# Patient Record
Sex: Female | Born: 1970 | Race: White | Hispanic: No
Health system: Southern US, Community
[De-identification: ages and names within clinical notes are randomized; demographics above are authoritative.]

## PROBLEM LIST (undated history)

## (undated) DIAGNOSIS — M797 Fibromyalgia: Secondary | ICD-10-CM

## (undated) DIAGNOSIS — F419 Anxiety disorder, unspecified: Secondary | ICD-10-CM

## (undated) DIAGNOSIS — F32A Depression, unspecified: Secondary | ICD-10-CM

## (undated) DIAGNOSIS — F909 Attention-deficit hyperactivity disorder, unspecified type: Secondary | ICD-10-CM

## (undated) DIAGNOSIS — M1990 Unspecified osteoarthritis, unspecified site: Secondary | ICD-10-CM

## (undated) DIAGNOSIS — E039 Hypothyroidism, unspecified: Secondary | ICD-10-CM

## (undated) DIAGNOSIS — D649 Anemia, unspecified: Secondary | ICD-10-CM

## (undated) HISTORY — DX: Fibromyalgia: M79.7

## (undated) HISTORY — PX: BRAIN SURGERY: SHX531

## (undated) HISTORY — PX: ABLATION: SHX5711

## (undated) HISTORY — PX: TONSILLECTOMY: SUR1361

## (undated) HISTORY — PX: KNEE ARTHROSCOPY: SUR90

## (undated) HISTORY — PX: BREAST ENHANCEMENT SURGERY: SHX7

## (undated) HISTORY — PX: TUBAL LIGATION: SHX77

---

## 2010-10-21 HISTORY — PX: GASTRIC BYPASS: SHX52

## 2014-10-21 HISTORY — PX: COMBINED AUGMENTATION MAMMAPLASTY AND ABDOMINOPLASTY: SUR291

## 2017-05-05 ENCOUNTER — Encounter (INDEPENDENT_AMBULATORY_CARE_PROVIDER_SITE_OTHER): Payer: Self-pay

## 2017-05-05 ENCOUNTER — Ambulatory Visit (INDEPENDENT_AMBULATORY_CARE_PROVIDER_SITE_OTHER): Payer: Self-pay

## 2017-05-05 ENCOUNTER — Encounter (INDEPENDENT_AMBULATORY_CARE_PROVIDER_SITE_OTHER): Payer: Self-pay | Admitting: Orthopaedic Surgery

## 2017-05-05 ENCOUNTER — Ambulatory Visit (INDEPENDENT_AMBULATORY_CARE_PROVIDER_SITE_OTHER): Payer: BLUE CROSS/BLUE SHIELD | Admitting: Orthopaedic Surgery

## 2017-05-05 VITALS — BP 110/71 | HR 81 | Resp 14 | Ht 67.0 in | Wt 125.0 lb

## 2017-05-05 DIAGNOSIS — M25562 Pain in left knee: Secondary | ICD-10-CM

## 2017-05-05 DIAGNOSIS — G8929 Other chronic pain: Secondary | ICD-10-CM

## 2017-05-05 MED ORDER — BUPIVACAINE HCL 0.5 % IJ SOLN
3.0000 mL | INTRAMUSCULAR | Status: AC | PRN
  Administered 2017-05-05: 3 mL via INTRA_ARTICULAR

## 2017-05-05 MED ORDER — LIDOCAINE HCL 1 % IJ SOLN
5.0000 mL | INTRAMUSCULAR | Status: AC | PRN
  Administered 2017-05-05: 5 mL

## 2017-05-05 MED ORDER — METHYLPREDNISOLONE ACETATE 40 MG/ML IJ SUSP
80.0000 mg | INTRAMUSCULAR | Status: AC | PRN
  Administered 2017-05-05: 80 mg

## 2017-05-05 NOTE — Progress Notes (Signed)
Office Visit Note   Patient: Regina Rollins           Date of Birth: 06/20/1971           MRN: [id number] Visit Date: 05/05/2017              Requested by: Verlee MonteWeeks, Susan R, NP [address] [place], [place] [id number] PCP: Verlee MonteWeeks, Susan R, NP   Assessment & Plan: Visit Diagnoses:  1. Chronic pain of left knee   Mild osteoarthritis left knee  Plan: Cortisone injection left knee today, follow-up with Visco supplementation in several weeks. Long discussion regarding diagnosis what she can expect over time. Follow-Up Instructions: Return in about 2 weeks (around 05/19/2017).   Orders:  Orders Placed This Encounter  Procedures  . XR KNEE 3 VIEW LEFT   No orders of the defined types were placed in this encounter.     Procedures: Large Joint Inj Date/Time: 05/05/2017 4:18 PM Performed by: Valeria BatmanWHITFIELD, Shellye Zandi W Authorized by: Valeria BatmanWHITFIELD, Corlene Sabia W   Consent Given by:  Patient Timeout: prior to procedure the correct patient, procedure, and site was verified   Indications:  Pain and joint swelling Location:  Knee Site:  L knee Prep: patient was prepped and draped in usual sterile fashion   Needle Size:  25 G Needle Length:  1.5 inches Approach:  Anteromedial Ultrasound Guidance: No   Fluoroscopic Guidance: No   Arthrogram: No   Medications:  3 mL bupivacaine 0.5 %; 5 mL lidocaine 1 %; 80 mg methylPREDNISolone acetate 40 MG/ML Aspiration Attempted: No   Patient tolerance:  Patient tolerated the procedure well with no immediate complications      Clinical Data: No additional findings.   Subjective: Chief Complaint  Patient presents with  . Left Knee - Pain    Regina Rollins is a 46  y o that presents with L knee pain. She relates hx of BIL knee arthroscopy and he L knee cap "moves around". She is experiencing pain and unable to sleep  Regina Rollins on has had chronic pain in her left knee for many years. I performed a knee arthroscopy in approximately 2012. I don't  have access to those notes. She believes it may have done a "release". 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 She's had some trouble off and on since that time. Sometimes she has a catching in her knee but she does note that years ago she had a course of Hyalgan with excellent relief of her pain. In the interim she's had a gastric bypass and has lost 140 pounds. Occasionally she'll a some stiffness in her left knee. No history of injury or trauma. Pain is not localized  HPI  Review of Systems   Objective: Vital Signs: BP 110/71   Pulse 81   Resp 14   Ht 5\' 7"  (1.702 m)   Wt 125 lb (56.7 kg)   BMI 19.58 kg/m   Physical Exam  Ortho Exam left knee with minimal effusion. The knee wasn't hot warm or red. No ecchymosis or erythema. No localized Tenderness along either medial or lateral joint line. Negative anterior drawer sign. No opening with a varus or valgus stress. No calf pain. No swelling distally. Neurovascular exam intact straight leg raise negative bilaterally. Painless range of motion of both hips. Patella seems somewhat mobile but no particular pain.  Specialty Comments:  No specialty comments available.  Imaging: Xr Knee 3 View Left  Result Date:  05/05/2017 3 views of the left knee were obtained standing projection. There are minimal degenerative changes in all 3 compartments. The joint spaces are well maintained. Some mild osteophytes. No ectopic calcification    PMFS History: There are no active problems to display for this patient.  No past medical history on file.  No family history on file.  Past Surgical History:  Procedure Laterality Date  . BREAST ENHANCEMENT SURGERY    . GASTRIC BYPASS    . KNEE ARTHROSCOPY    . TUBAL LIGATION    . TUBAL LIGATION     Social History   Occupational History  . Not on file.   Social History Main Topics  . Smoking status: Never Smoker  . Smokeless tobacco: Never Used  .  Alcohol use No  . Drug use: No  . Sexual activity: Not on file     Valeria Batman, MD   Note - This record has been created using [employer].  Chart creation errors have been sought, but may not always  have been located. Such creation errors do not reflect on  the standard of medical care.

## 2017-05-12 ENCOUNTER — Telehealth (INDEPENDENT_AMBULATORY_CARE_PROVIDER_SITE_OTHER): Payer: Self-pay | Admitting: Orthopaedic Surgery

## 2017-05-12 NOTE — Telephone Encounter (Signed)
[facility] called about Euflexxa injections to verify rx. Please call [phone] opt.1

## 2017-05-15 NOTE — Telephone Encounter (Signed)
Called number and they are in process of verifying info.

## 2017-05-21 ENCOUNTER — Encounter (INDEPENDENT_AMBULATORY_CARE_PROVIDER_SITE_OTHER): Payer: Self-pay | Admitting: Orthopedic Surgery

## 2017-05-21 ENCOUNTER — Ambulatory Visit (INDEPENDENT_AMBULATORY_CARE_PROVIDER_SITE_OTHER): Payer: BLUE CROSS/BLUE SHIELD | Admitting: Orthopedic Surgery

## 2017-05-21 ENCOUNTER — Encounter (INDEPENDENT_AMBULATORY_CARE_PROVIDER_SITE_OTHER): Payer: Self-pay

## 2017-05-21 VITALS — BP 122/86 | HR 87 | Resp 14 | Ht 67.0 in | Wt 144.0 lb

## 2017-05-21 DIAGNOSIS — M1712 Unilateral primary osteoarthritis, left knee: Secondary | ICD-10-CM | POA: Diagnosis not present

## 2017-05-21 MED ORDER — LIDOCAINE HCL 1 % IJ SOLN
3.0000 mL | INTRAMUSCULAR | Status: AC | PRN
  Administered 2017-05-21: 3 mL

## 2017-05-21 MED ORDER — SODIUM HYALURONATE (VISCOSUP) 20 MG/2ML IX SOSY
20.0000 mg | PREFILLED_SYRINGE | INTRA_ARTICULAR | Status: AC | PRN
  Administered 2017-05-21: 20 mg via INTRA_ARTICULAR

## 2017-05-21 NOTE — Progress Notes (Signed)
Office Visit Note   Patient: Regina Dedra SkeensMichele Rollins           Date of Birth: 10/16/1971           MRN: [id number] Visit Date: 05/21/2017              Requested by: Verlee MonteWeeks, Susan R, NP [address] [place], [place] [id number] PCP: Verlee MonteWeeks, Susan R, NP   Assessment & Plan: Visit Diagnoses:  1. Osteoarthritis of left patellofemoral joint     Plan: #1: First Euflex injection was given without difficulty to the left knee  Follow-Up Instructions: Return in about 1 week (around 05/28/2017).   Orders:  No orders of the defined types were placed in this encounter.  No orders of the defined types were placed in this encounter.     Procedures: Large Joint Inj Date/Time: 05/21/2017 9:31 AM Performed by: Jacqualine CodePETRARCA, Ahmani Prehn D Authorized by: Jacqualine CodePETRARCA, Jontavious Commons D   Consent Given by:  Patient Timeout: prior to procedure the correct patient, procedure, and site was verified   Indications:  Pain and joint swelling Location:  Knee Site:  L knee Prep: patient was prepped and draped in usual sterile fashion   Needle Size:  25 G Needle Length:  1.5 inches Approach:  Anteromedial Ultrasound Guidance: No   Fluoroscopic Guidance: No   Arthrogram: No   Medications:  20 mg Sodium Hyaluronate 20 MG/2ML; 3 mL lidocaine 1 % Aspiration Attempted: No   Patient tolerance:  Patient tolerated the procedure well with no immediate complications     Clinical Data: No additional findings.   Subjective: Chief Complaint  Patient presents with  . Right Knee - Pain  . Knee Pain    Some improvement after inj., some swelling, not diabetic, arthroscopic - Dr. Dawna PartWhtifield    Mrs. Regina Rollins has had chronic pain in her left knee for many years. 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 At that time she had a chondroplasty  patella medial femoral condyle as well as a partial lateral meniscectomy and an arthroscopic lateral release. She's had some trouble off and on since that time. Sometimes she has a catching in her knee but she does note that years ago she had a course of Hyalgan with excellent relief of her pain. In the interim she's had a gastric bypass and has lost 140 pounds. Occasionally she'll have  some stiffness in her left knee. No history of injury or trauma. She was seen 2 weeks ago had a corticosteroid injection into the knee tolerated it well. She returns today for consideration of Visco supplementation.    Review of Systems  Gastrointestinal:       H/O gastric bypass with excellent results     Objective: Vital Signs: BP 122/86 (BP Location: Right Arm, Patient Position: Sitting, Cuff Size: Normal)   Pulse 87   Resp 14   Ht 5\' 7"  (1.702 m)   Wt 144 lb (65.3 kg)   BMI 22.55 kg/m   Physical Exam  Constitutional: She is oriented to person, place, and time. She appears well-developed and well-nourished.  HENT:  Head: Normocephalic and atraumatic.  Eyes: Pupils are equal, round, and reactive to light. EOM are normal.  Pulmonary/Chest: Effort normal.  Neurological: She is alert and oriented to person, place, and time.  Skin: Skin is warm and dry.  Psychiatric: She has a normal mood and affect. Her behavior is normal. Judgment and thought content normal.    Ortho Exam  She has minimal effusion today. No warmth or erythema. Does have some patellofemoral crepitance with range of motion. Calf is supple nontender. Neurovascularly intact.  Specialty Comments:  No specialty comments available.  Imaging: No results found.   PMFS History: Patient Active Problem List   Diagnosis Date Noted  . Unilateral primary osteoarthritis, left knee 05/21/2017  . Osteoarthritis of left patellofemoral joint 05/21/2017   Past Medical History:  Diagnosis Date  . Fibromyositis     History reviewed. No  pertinent family history.  Past Surgical History:  Procedure Laterality Date  . BREAST ENHANCEMENT SURGERY    . CESAREAN SECTION    . GASTRIC BYPASS    . KNEE ARTHROSCOPY    . TUBAL LIGATION    . TUBAL LIGATION     Social History   Occupational History  . Not on file.   Social History Main Topics  . Smoking status: Former Smoker    Packs/day: 0.10    Years: 3.00    Types: Cigarettes    Quit date: 05/21/2002  . Smokeless tobacco: Never Used  . Alcohol use No  . Drug use: No  . Sexual activity: Not on file

## 2017-05-27 ENCOUNTER — Other Ambulatory Visit: Payer: Self-pay | Admitting: *Deleted

## 2017-05-28 ENCOUNTER — Encounter (INDEPENDENT_AMBULATORY_CARE_PROVIDER_SITE_OTHER): Payer: Self-pay | Admitting: Orthopedic Surgery

## 2017-05-28 ENCOUNTER — Other Ambulatory Visit: Payer: Self-pay | Admitting: Orthopedic Surgery

## 2017-05-28 ENCOUNTER — Ambulatory Visit (INDEPENDENT_AMBULATORY_CARE_PROVIDER_SITE_OTHER): Payer: BLUE CROSS/BLUE SHIELD | Admitting: Orthopedic Surgery

## 2017-05-28 DIAGNOSIS — M1712 Unilateral primary osteoarthritis, left knee: Secondary | ICD-10-CM

## 2017-05-28 MED ORDER — DICLOFENAC SODIUM 1 % TD GEL: 2.0000 g | Freq: Four times a day (QID) | TRANSDERMAL | 1 refills | Status: DC

## 2017-05-28 MED ORDER — LIDOCAINE HCL 1 % IJ SOLN
3.0000 mL | INTRAMUSCULAR | Status: AC | PRN
  Administered 2017-05-28: 3 mL

## 2017-05-28 MED ORDER — SODIUM HYALURONATE (VISCOSUP) 20 MG/2ML IX SOSY
20.0000 mg | PREFILLED_SYRINGE | INTRA_ARTICULAR | Status: AC | PRN
  Administered 2017-05-28: 20 mg via INTRA_ARTICULAR

## 2017-05-28 NOTE — Progress Notes (Signed)
   Office Visit Note   Patient: Regina Rollins           Date of Birth: 05/11/1971           MRN: [id number] Visit Date: 05/28/2017              Requested by: Verlee MonteWeeks, Susan R, NP [address] [place], [place] [id number] PCP: Verlee MonteWeeks, Susan R, NP   Assessment & Plan: Visit Diagnoses:  1. Unilateral primary osteoarthritis, left knee     Plan:  #1: The second Euflex injection was given left knee. Tolerated procedure well #2: Follow back up in 1 week for her final injection to the left knee.  Follow-Up Instructions: Return in about 1 week (around 06/04/2017).   Orders:  Orders Placed This Encounter  Procedures  . Large Joint Injection/Arthrocentesis   No orders of the defined types were placed in this encounter.     Procedures: Large Joint Inj Date/Time: 05/28/2017 9:10 AM Performed by: Jacqualine CodePETRARCA, Tavius Turgeon D Authorized by: Jacqualine CodePETRARCA, Emmali Karow D   Consent Given by:  Patient Timeout: prior to procedure the correct patient, procedure, and site was verified   Indications:  Pain and joint swelling Location:  Knee Site:  L knee Prep: patient was prepped and draped in usual sterile fashion   Needle Size:  25 G Needle Length:  1.5 inches Approach:  Anteromedial Ultrasound Guidance: No   Fluoroscopic Guidance: No   Arthrogram: No   Medications:  20 mg Sodium Hyaluronate 20 MG/2ML; 3 mL lidocaine 1 % Aspiration Attempted: No   Patient tolerance:  Patient tolerated the procedure well with no immediate complications      Clinical Data: No additional findings.   Subjective: Chief Complaint  Patient presents with  . Left Knee - Pain    HPI  Regina Rollins returns today for her second Euflexa injection. She states over the past 2 days her knee started hurting again. Denies any reactivity. Review of Systems   Objective: Vital Signs: There were no vitals taken for this visit.  Physical Exam  Ortho Exam  Exam today reveals the knee to be benign. Maybe a trace effusion.  No warmth or erythema  Specialty Comments:  No specialty comments available.  Imaging: No results found.   PMFS History: Patient Active Problem List   Diagnosis Date Noted  . Unilateral primary osteoarthritis, left knee 05/21/2017  . Osteoarthritis of left patellofemoral joint 05/21/2017   Past Medical History:  Diagnosis Date  . Fibromyositis     No family history on file.  Past Surgical History:  Procedure Laterality Date  . BREAST ENHANCEMENT SURGERY    . CESAREAN SECTION    . GASTRIC BYPASS    . KNEE ARTHROSCOPY    . TUBAL LIGATION    . TUBAL LIGATION     Social History   Occupational History  . Not on file.   Social History Main Topics  . Smoking status: Former Smoker    Packs/day: 0.10    Years: 3.00    Types: Cigarettes    Quit date: 05/21/2002  . Smokeless tobacco: Never Used  . Alcohol use No  . Drug use: No  . Sexual activity: Not on file

## 2017-05-28 NOTE — Addendum Note (Signed)
Addended by: Jacqualine CodePETRARCA, Oluwasemilore Bahl on: 05/28/2017 12:38 PM   Modules accepted: Orders

## 2017-06-04 ENCOUNTER — Ambulatory Visit (INDEPENDENT_AMBULATORY_CARE_PROVIDER_SITE_OTHER): Payer: BLUE CROSS/BLUE SHIELD | Admitting: Orthopedic Surgery

## 2017-06-04 ENCOUNTER — Encounter (INDEPENDENT_AMBULATORY_CARE_PROVIDER_SITE_OTHER): Payer: Self-pay | Admitting: Orthopedic Surgery

## 2017-06-04 VITALS — BP 125/88 | Ht 67.0 in | Wt 144.0 lb

## 2017-06-04 DIAGNOSIS — M1712 Unilateral primary osteoarthritis, left knee: Secondary | ICD-10-CM

## 2017-06-04 MED ORDER — SODIUM HYALURONATE (VISCOSUP) 20 MG/2ML IX SOSY
20.0000 mg | PREFILLED_SYRINGE | INTRA_ARTICULAR | Status: AC | PRN
  Administered 2017-06-04: 20 mg via INTRA_ARTICULAR

## 2017-06-04 MED ORDER — LIDOCAINE HCL 1 % IJ SOLN
3.0000 mL | INTRAMUSCULAR | Status: AC | PRN
  Administered 2017-06-04: 3 mL

## 2017-06-04 NOTE — Progress Notes (Signed)
Office Visit Note   Patient: Regina Rollins           Date of Birth: 09/17/1971           MRN: [id number] Visit Date: 06/04/2017              Requested by: Verlee MonteWeeks, Susan R, NP [address] [place] [id number] PCP: Verlee MonteWeeks, Susan R, NP   Assessment & Plan: Visit Diagnoses:  1. Unilateral primary osteoarthritis, left knee     Plan: #1: Third Euflexa injection was given to the left knee and tolerated the procedure well #2: Follow back up when necessary #3: Continue Voltaren gel as needed.  Follow-Up Instructions: Return if symptoms worsen or fail to improve.   Orders:  No orders of the defined types were placed in this encounter.  No orders of the defined types were placed in this encounter.     Procedures: Large Joint Inj Date/Time: 06/04/2017 11:17 AM Performed by: Jacqualine CodePETRARCA, Rogene Meth D Authorized by: Jacqualine CodePETRARCA, Dejean Tribby D   Consent Given by:  Patient Timeout: prior to procedure the correct patient, procedure, and site was verified   Indications:  Pain and joint swelling Location:  Knee Site:  L knee Prep: patient was prepped and draped in usual sterile fashion   Needle Size:  25 G Needle Length:  1.5 inches Approach:  Anteromedial Ultrasound Guidance: No   Fluoroscopic Guidance: No   Arthrogram: No   Medications:  20 mg Sodium Hyaluronate 20 MG/2ML; 3 mL lidocaine 1 % Aspiration Attempted: No   Patient tolerance:  Patient tolerated the procedure well with no immediate complications     Clinical Data: No additional findings.   Subjective: Chief Complaint  Patient presents with  . Left Knee - Injections    Patient is here for left knee injection--#3 Euflexxa injection. She says the knee is feeling better.    HPI   Regina Elon JesterMichele returns today for follow-up of her Euflex injections. She is here for her third so far. She states she is actually had improvement and is happy with the results at this time. Denies any reactivity.   Review of Systems    All other systems reviewed and are negative.    Objective: Vital Signs: BP 125/88 (BP Location: Left Arm, Patient Position: Sitting)   Ht 5\' 7"  (1.702 m)   Wt 144 lb (65.3 kg)   BMI 22.55 kg/m   Physical Exam  Ortho Exam  The left knee is quite benign at this time. No signs of inflammation. No effusion.  Specialty Comments:  No specialty comments available.  Imaging: No results found.   PMFS History: Patient Active Problem List   Diagnosis Date Noted  . Unilateral primary osteoarthritis, left knee 05/21/2017  . Osteoarthritis of left patellofemoral joint 05/21/2017   Past Medical History:  Diagnosis Date  . Fibromyositis     No family history on file.  Past Surgical History:  Procedure Laterality Date  . BREAST ENHANCEMENT SURGERY    . CESAREAN SECTION    . GASTRIC BYPASS    . KNEE ARTHROSCOPY    . TUBAL LIGATION    . TUBAL LIGATION     Social History   Occupational History  . Not on file.   Social History Main Topics  . Smoking status: Former Smoker    Packs/day: 0.10    Years: 3.00    Types: Cigarettes    Quit date: 05/21/2002  . Smokeless tobacco: Never Used  . Alcohol use No  .  Drug use: No  . Sexual activity: Not on file       

## 2017-06-19 NOTE — Telephone Encounter (Signed)
In progress

## 2017-10-20 ENCOUNTER — Ambulatory Visit (INDEPENDENT_AMBULATORY_CARE_PROVIDER_SITE_OTHER): Payer: Self-pay

## 2017-10-20 ENCOUNTER — Encounter (INDEPENDENT_AMBULATORY_CARE_PROVIDER_SITE_OTHER): Payer: Self-pay | Admitting: Orthopaedic Surgery

## 2017-10-20 ENCOUNTER — Ambulatory Visit (INDEPENDENT_AMBULATORY_CARE_PROVIDER_SITE_OTHER): Payer: BLUE CROSS/BLUE SHIELD | Admitting: Orthopaedic Surgery

## 2017-10-20 VITALS — BP 138/77 | HR 82 | Resp 14 | Ht 67.5 in | Wt 149.0 lb

## 2017-10-20 DIAGNOSIS — M79641 Pain in right hand: Secondary | ICD-10-CM

## 2017-10-20 MED ORDER — METHYLPREDNISOLONE ACETATE 40 MG/ML IJ SUSP
20.0000 mg | INTRAMUSCULAR | Status: AC | PRN
  Administered 2017-10-20: 20 mg via INTRA_ARTICULAR

## 2017-10-20 MED ORDER — LIDOCAINE HCL 1 % IJ SOLN
0.5000 mL | INTRAMUSCULAR | Status: AC | PRN
  Administered 2017-10-20: .5 mL

## 2017-10-20 NOTE — Progress Notes (Signed)
Office Visit Note   Patient: Regina Rollins           Date of Birth: 12/22/1970           MRN: [id number] Visit Date: 10/20/2017              Requested by: Verlee MonteWeeks, Susan R, NP [address] [place] [id number] PCP: Verlee MonteWeeks, Susan R, NP   Assessment & Plan: Visit Diagnoses:  1. Pain in right hand     Plan: Problem seems to be localized to the base of the thumb where there are some mild arthritic changes by x-ray. Patient does have history of fibromyalgia which I think aggravates her pain. I will inject the base of the thumb and monitor her response  Follow-Up Instructions: Return if symptoms worsen or fail to improve.   Orders:  Orders Placed This Encounter  Procedures  . Small Joint Inj: R thumb MCP  . XR Hand Complete Right   No orders of the defined types were placed in this encounter.     Procedures: Small Joint Inj: R thumb MCP on 10/20/2017 3:08 PM Indications: pain Details: 27 G needle, dorsal approach  Spinal Needle: No  Medications: 0.5 mL lidocaine 1 %; 20 mg methylPREDNISolone acetate 40 MG/ML      Clinical Data: No additional findings.   Subjective: Chief Complaint  Patient presents with  . Right Wrist - Pain  . 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 Having difficulty unscrewing lids and picking up heavy objects with what appears to be pain near the metacarpal phalangeal joint. No swelling. No numbness or tingling at this point although she has been diagnosed with mild carpal tunnel syndrome apparently with nerve conduction studies  HPI  Review of Systems  Constitutional: Positive for activity change.  HENT: Negative for trouble swallowing.   Eyes: Negative for pain.  Respiratory: Positive for  shortness of breath.   Cardiovascular: Negative for chest pain.  Gastrointestinal: Negative for constipation.  Endocrine: Positive for cold intolerance.  Genitourinary: Negative for difficulty urinating.  Musculoskeletal: Positive for joint swelling.  Skin: Negative for rash.  Allergic/Immunologic: Negative for food allergies.  Neurological: Positive for numbness.  Hematological: Does not bruise/bleed easily.  Psychiatric/Behavioral: Negative for sleep disturbance.     Objective: Vital Signs: BP 138/77 (BP Location: Left Arm, Patient Position: Sitting, Cuff Size: Normal)   Pulse 82   Resp 14   Ht 5' 7.5" (1.715 m)   Wt 149 lb (67.6 kg)   BMI 22.99 kg/m   Physical Exam  Ortho Exam awake alert and oriented 3 comfortable sitting. Examination the right wrist reveals some mild pain at the base of the thumb with a positive grind test. Regina Rollins has marked a number of areas where she is having pain but there are no skin changes is no erythema or ecchymosis or swelling. Negative Phalen's and Tinel's test. Good grip and good release. No localized tenderness in the snuffbox or over the distal radius or ulna. Good strength with opposition of thumb to little finger. Mild pain at the base of the thumb which I think is causing her most of her problem.  Specialty Comments:  No specialty comments available.  Imaging: Xr Hand Complete Right  Result  Date: 10/20/2017 Films of the right wrist obtained in several projections. There is no evidence of a fracture dislocation. Joint spaces are well maintained in the carpus. There are some mild degenerative changes in the base of the thumb. No ectopic calcification.    PMFS History: Patient Active Problem List   Diagnosis Date Noted  . Unilateral primary osteoarthritis, left knee 05/21/2017  . Osteoarthritis of left patellofemoral joint 05/21/2017   Past Medical History:  Diagnosis Date  . Fibromyositis     History reviewed. No pertinent  family history.  Past Surgical History:  Procedure Laterality Date  . BREAST ENHANCEMENT SURGERY    . CESAREAN SECTION    . GASTRIC BYPASS    . KNEE ARTHROSCOPY    . TUBAL LIGATION    . TUBAL LIGATION     Social History   Occupational History  . Not on file  Tobacco Use  . Smoking status: Former Smoker    Packs/day: 0.10    Years: 3.00    Pack years: 0.30    Types: Cigarettes    Last attempt to quit: 05/21/2002    Years since quitting: 15.4  . Smokeless tobacco: Never Used  Substance and Sexual Activity  . Alcohol use: No  . Drug use: No  . Sexual activity: Not on file

## 2017-12-29 ENCOUNTER — Encounter (INDEPENDENT_AMBULATORY_CARE_PROVIDER_SITE_OTHER): Payer: Self-pay | Admitting: Orthopaedic Surgery

## 2017-12-29 ENCOUNTER — Ambulatory Visit (INDEPENDENT_AMBULATORY_CARE_PROVIDER_SITE_OTHER): Payer: BLUE CROSS/BLUE SHIELD | Admitting: Orthopaedic Surgery

## 2017-12-29 VITALS — BP 141/95 | HR 89 | Ht 67.0 in | Wt 140.0 lb

## 2017-12-29 DIAGNOSIS — M79641 Pain in right hand: Secondary | ICD-10-CM

## 2017-12-29 MED ORDER — METHYLPREDNISOLONE ACETATE 40 MG/ML IJ SUSP
20.0000 mg | INTRAMUSCULAR | Status: AC | PRN
  Administered 2017-12-29: 20 mg via INTRA_ARTICULAR

## 2017-12-29 MED ORDER — LIDOCAINE HCL (PF) 1 % IJ SOLN
1.0000 mL | INTRAMUSCULAR | Status: AC | PRN
  Administered 2017-12-29: 1 mL

## 2017-12-29 NOTE — Progress Notes (Signed)
Office Visit Note   Patient: Regina Rollins           Date of Birth: 04/20/1971           MRN: [id number] Visit Date: 12/29/2017              Requested by: Verlee MonteWeeks, Susan R, NP [address] [place] [id number] PCP: Verlee MonteWeeks, Susan R, NP   Assessment & Plan: Visit Diagnoses:  1. Pain of right hand     Plan: osteoarthritis base of right thumb. We will reinject. Long discussion regarding treatment options over time. Also has chronic neck pain with recent CT scan elsewhere demonstrating mild arthritis at C6-7. Discussed treatment options  Follow-Up Instructions: Return if symptoms worsen or fail to improve.   Orders:  Orders Placed This Encounter  Procedures  . Small Joint Inj: R thumb CMC   No orders of the defined types were placed in this encounter.     Procedures: Small Joint Inj: R thumb CMC on 12/29/2017 1:20 PM Details: 27 G needle, dorsal approach  Spinal Needle: No  Medications: 1 mL lidocaine (PF) 1 %; 20 mg methylPREDNISolone acetate 40 MG/ML      Clinical Data: No additional findings.   Subjective: Chief Complaint  Patient presents with  . Right Hand - Pain    Regina Rollins IS 46 Y O F HERE FOR RIGHT HAND INJECTION AND WOULD LIKE TO DISCUSS NECK TENSION. GOING ON FOR ABOUT A YEAR GETTING WORSE.  last injection about 3 months ago.  Has recurrent symptoms at the base  Of right thumb. Also having chronic pain at the base of the neck. [facility] demonstrating mild arthritis at C6-7. No numbness or tingling. 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  HENT: Negative for ear pain.   Eyes: Negative for pain.  Respiratory: Negative for cough and shortness of breath.   Cardiovascular: Negative for leg swelling.  Gastrointestinal: Negative for blood in stool, constipation and diarrhea.  Genitourinary: Negative for dysuria.  Musculoskeletal: Negative for back pain and neck pain.   Skin: Negative for rash.  Allergic/Immunologic: Negative for food allergies.  Neurological: Negative for dizziness, weakness, light-headedness, numbness and headaches.  Hematological: Bruises/bleeds easily.  Psychiatric/Behavioral: Negative for sleep disturbance.     Objective: Vital Signs: BP (!) 141/95 (BP Location: Left Arm, Patient Position: Sitting, Cuff Size: Normal)   Pulse 89   Ht 5\' 7"  (1.702 m)   Wt 140 lb (63.5 kg)   BMI 21.93 kg/m   Physical Exam  Ortho Examwake alert and oriented 3. Mild pain base of right thumb with  Motion. Skin intact. Neurovascular exam int No deformity. Full range of motion of cervical spine without referred pain.no masses. No localized areas of tendernes  Specialty Comments:  No specialty comments available.  Imaging: No results found.   PMFS History: Patient Active Problem List   Diagnosis Date Noted  . Unilateral primary osteoarthritis, left knee 05/21/2017  . Osteoarthritis of left patellofemoral joint 05/21/2017   Past Medical History:  Diagnosis Date  . Fibromyositis     History reviewed. No pertinent family history.  Past Surgical History:  Procedure Laterality Date  . BREAST ENHANCEMENT SURGERY    . CESAREAN SECTION    . GASTRIC BYPASS    . KNEE ARTHROSCOPY    . TUBAL LIGATION    . TUBAL LIGATION     Social History  Occupational History  . Not on file  Tobacco Use  . Smoking status: Former Smoker    Packs/day: 0.10    Years: 3.00    Pack years: 0.30    Types: Cigarettes    Last attempt to quit: 05/21/2002    Years since quitting: 15.6  . Smokeless tobacco: Never Used  Substance and Sexual Activity  . Alcohol use: No  . Drug use: No  . Sexual activity: Not on file

## 2017-12-30 ENCOUNTER — Ambulatory Visit (INDEPENDENT_AMBULATORY_CARE_PROVIDER_SITE_OTHER): Payer: BLUE CROSS/BLUE SHIELD | Admitting: Orthopaedic Surgery

## 2018-04-09 ENCOUNTER — Ambulatory Visit (INDEPENDENT_AMBULATORY_CARE_PROVIDER_SITE_OTHER): Payer: BLUE CROSS/BLUE SHIELD | Admitting: Orthopaedic Surgery

## 2018-04-09 ENCOUNTER — Encounter (INDEPENDENT_AMBULATORY_CARE_PROVIDER_SITE_OTHER): Payer: Self-pay | Admitting: Orthopaedic Surgery

## 2018-04-09 VITALS — BP 125/79 | HR 87 | Ht 67.0 in | Wt 149.0 lb

## 2018-04-09 DIAGNOSIS — M19041 Primary osteoarthritis, right hand: Secondary | ICD-10-CM | POA: Insufficient documentation

## 2018-04-09 MED ORDER — METHYLPREDNISOLONE ACETATE 40 MG/ML IJ SUSP
20.0000 mg | INTRAMUSCULAR | Status: AC | PRN
  Administered 2018-04-09: 20 mg

## 2018-04-09 MED ORDER — LIDOCAINE HCL 1 % IJ SOLN
1.0000 mL | INTRAMUSCULAR | Status: AC | PRN
  Administered 2018-04-09: 1 mL

## 2018-04-09 NOTE — Progress Notes (Signed)
Office Visit Note   Patient: Regina Rollins           Date of Birth: 08/16/1971           MRN: [id number] Visit Date: 04/09/2018              Requested by: Verlee MonteWeeks, Susan R, NP [address] [place], [place] [id number] PCP: Verlee MonteWeeks, Susan R, NP   Assessment & Plan: Visit Diagnoses:  1. Primary osteoarthritis, right hand     Plan: 2 arthritis base of right thumb.  Long discussion again regarding diagnosis and treatment options.  At some point I think it is worth referring to a hand surgeon to consider definitive surgical treatment.  Today I will reinject with cortisone  Follow-Up Instructions: Return if symptoms worsen or fail to improve.   Orders:  Orders Placed This Encounter  Procedures  . Hand/UE Inj: L thumb CMC   No orders of the defined types were placed in this encounter.     Procedures: Hand/UE Inj: L thumb CMC for osteoarthritis on 04/09/2018 1:25 PM Medications: 1 mL lidocaine 1 %; 20 mg methylPREDNISolone acetate 40 MG/ML      Clinical Data: No additional findings.   Subjective: Chief Complaint  Patient presents with  . Right Hand - Pain  . Follow-up    R HAND PAIN CHRONIC, WOULD LIKE INJECTION   Last seen 3 months ago for evaluation of osteoarthritis of the base of the right thumb.  I injected the joint with cortisone with excellent relief of her pain.  She has had some recurrence of her pain without injury or trauma.  No numbness tingling or swelling. HPI  Review of Systems  Constitutional: Negative for fatigue and fever.  HENT: Negative for ear pain.   Eyes: Negative for pain.  Respiratory: Negative for cough and shortness of breath.   Cardiovascular: Positive for leg swelling.  Gastrointestinal: Negative for constipation and diarrhea.  Genitourinary: Negative for difficulty urinating.  Musculoskeletal: Positive for neck pain. Negative for back pain.  Skin: Negative for rash.  Allergic/Immunologic: Negative for food allergies.    Neurological: Negative for weakness and numbness.  Hematological: Bruises/bleeds easily.  Psychiatric/Behavioral: Negative for sleep disturbance.     Objective: Vital Signs: BP 125/79 (BP Location: Left Arm, Patient Position: Sitting, Cuff Size: Normal)   Pulse 87   Ht 5\' 7"  (1.702 m)   Wt 149 lb (67.6 kg)   BMI 23.34 kg/m   Physical Exam  Ortho Exam awake alert and oriented x3.  Comfortable sitting.  Exam of the right hand notes a positive grind test at the base of the thumb.  No obvious subluxation.  No swelling.  Neurovascular exam intact.  Good grip and good release.  No pain along the first dorsal extensor compartment  Specialty Comments:  No specialty comments available.  Imaging: No results found.   PMFS History: Patient Active Problem List   Diagnosis Date Noted  . Primary osteoarthritis, right hand 04/09/2018  . Unilateral primary osteoarthritis, left knee 05/21/2017  . Osteoarthritis of left patellofemoral joint 05/21/2017   Past Medical History:  Diagnosis Date  . Fibromyositis     History reviewed. No pertinent family history.  Past Surgical History:  Procedure Laterality Date  . BREAST ENHANCEMENT SURGERY    . CESAREAN SECTION    . GASTRIC BYPASS    . KNEE ARTHROSCOPY    . TUBAL LIGATION    . TUBAL LIGATION     Social History   Occupational  History  . Not on file  Tobacco Use  . Smoking status: Former Smoker    Packs/day: 0.10    Years: 3.00    Pack years: 0.30    Types: Cigarettes    Last attempt to quit: 05/21/2002    Years since quitting: 15.8  . Smokeless tobacco: Never Used  Substance and Sexual Activity  . Alcohol use: No  . Drug use: No  . Sexual activity: Not on file

## 2018-08-13 ENCOUNTER — Encounter (INDEPENDENT_AMBULATORY_CARE_PROVIDER_SITE_OTHER): Payer: Self-pay

## 2018-08-13 ENCOUNTER — Ambulatory Visit (INDEPENDENT_AMBULATORY_CARE_PROVIDER_SITE_OTHER): Payer: BLUE CROSS/BLUE SHIELD | Admitting: Orthopaedic Surgery

## 2018-08-13 ENCOUNTER — Encounter (INDEPENDENT_AMBULATORY_CARE_PROVIDER_SITE_OTHER): Payer: Self-pay | Admitting: Orthopaedic Surgery

## 2018-08-13 ENCOUNTER — Other Ambulatory Visit (INDEPENDENT_AMBULATORY_CARE_PROVIDER_SITE_OTHER): Payer: Self-pay | Admitting: Radiology

## 2018-08-13 ENCOUNTER — Other Ambulatory Visit (INDEPENDENT_AMBULATORY_CARE_PROVIDER_SITE_OTHER): Payer: Self-pay | Admitting: *Deleted

## 2018-08-13 VITALS — BP 134/86 | HR 88 | Ht 67.0 in | Wt 149.0 lb

## 2018-08-13 DIAGNOSIS — M79641 Pain in right hand: Secondary | ICD-10-CM

## 2018-08-13 DIAGNOSIS — M25531 Pain in right wrist: Secondary | ICD-10-CM

## 2018-08-13 MED ORDER — METHYLPREDNISOLONE ACETATE 40 MG/ML IJ SUSP
20.0000 mg | INTRAMUSCULAR | Status: AC | PRN
  Administered 2018-08-13: 20 mg via INTRA_ARTICULAR

## 2018-08-13 MED ORDER — LIDOCAINE HCL 1 % IJ SOLN
0.5000 mL | INTRAMUSCULAR | Status: AC | PRN
  Administered 2018-08-13: .5 mL

## 2018-08-13 MED ORDER — BUPIVACAINE HCL 0.5 % IJ SOLN
0.5000 mL | INTRAMUSCULAR | Status: AC | PRN
  Administered 2018-08-13: .5 mL via INTRA_ARTICULAR

## 2018-08-13 NOTE — Progress Notes (Signed)
Office Visit Note   Patient: Regina Rollins           Date of Birth: 1971-08-13           MRN: [id number] Visit Date: 08/13/2018              Requested by: Verlee Monte, NP [address] [place], [place] [address] PCP: Verlee Monte, NP   Assessment & Plan: Visit Diagnoses:  1. Pain of right hand     Plan: Recurrent pain right hand with prior diagnosis arthritis base of thumb.  Will inject.  Also has an area of tenderness over the radiocarpal joint which I will inject as well.  Refer to hand surgeon for further evaluation and possible treatment  Follow-Up Instructions: Return if symptoms worsen or fail to improve.   Orders:  Orders Placed This Encounter  Procedures  . Rollins Joint Inj: R thumb CMC   No orders of the defined types were placed in this encounter.     Procedures: Rollins Joint Inj: R thumb CMC on 08/13/2018 3:12 PM Details: dorsal approach Medications: 0.5 mL lidocaine 1 %; 0.5 mL bupivacaine 0.5 %; 20 mg methylPREDNISolone acetate 40 MG/ML      Clinical Data: No additional findings.   Subjective: Chief Complaint  Patient presents with  . Follow-up    R HAND WRIST PAN FOR 1 YR POSSIBLE INJECTION  Mrs. Regina Rollins has been seen on several occasions in the past for evaluation of right hand pain.  She has evidence of degenerative arthritis at the base of the thumb.  Prior cortisone injections in that area have been very helpful.  She is had some recent recurrence to the point of compromise.  She would like to have another injection and a referral to 1 of the hand surgeons.  HPI  Review of Systems  Constitutional: Negative for fatigue and fever.  Eyes: Negative for pain.  Respiratory: Negative for cough and shortness of breath.   Cardiovascular: Positive for leg swelling.  Gastrointestinal: Positive for constipation. Negative for diarrhea.  Genitourinary: Negative for difficulty urinating.  Musculoskeletal: Negative for back pain and neck pain.   Skin: Negative for rash.  Allergic/Immunologic: Negative for food allergies.  Neurological: Positive for weakness. Negative for numbness.  Hematological: Bruises/bleeds easily.  Psychiatric/Behavioral: Negative for sleep disturbance.     Objective: Vital Signs: BP 134/86 (BP Location: Left Arm, Patient Position: Sitting, Cuff Size: Normal)   Pulse 88   Ht 5\' 7"  (1.702 m)   Wt 149 lb (67.6 kg)   BMI 23.34 kg/m   Physical Exam  Constitutional: She is oriented to person, place, and time. She appears well-developed and well-nourished.  HENT:  Mouth/Throat: Oropharynx is clear and moist.  Eyes: Pupils are equal, round, and reactive to light. EOM are normal.  Pulmonary/Chest: Effort normal.  Neurological: She is alert and oriented to person, place, and time.  Skin: Skin is warm and dry.  Psychiatric: She has a normal mood and affect. Her behavior is normal.    Ortho Exam awake alert and oriented x3.  Comfortable sitting.  Pain along the carpometacarpal joint of right thumb without hypertrophic changes.  Negative grind test.  No swelling.  Neurovascular exam intact also has some tenderness over the radiocarpal joint dorsally.  No skin changes or masses.  No pain along the first dorsal extensor compartment or the IP joint of the thumb.  No carpal tunnel symptoms  Specialty Comments:  No specialty comments available.  Imaging: No  results found.   PMFS History: Patient Active Problem List   Diagnosis Date Noted  . Primary osteoarthritis, right hand 04/09/2018  . Unilateral primary osteoarthritis, left knee 05/21/2017  . Osteoarthritis of left patellofemoral joint 05/21/2017   Past Medical History:  Diagnosis Date  . Fibromyositis     History reviewed. No pertinent family history.  Past Surgical History:  Procedure Laterality Date  . BREAST ENHANCEMENT SURGERY    . CESAREAN SECTION    . GASTRIC BYPASS    . KNEE ARTHROSCOPY    . TUBAL LIGATION    . TUBAL LIGATION      Social History   Occupational History  . Not on file  Tobacco Use  . Smoking status: Former Smoker    Packs/day: 0.10    Years: 3.00    Pack years: 0.30    Types: Cigarettes    Last attempt to quit: 05/21/2002    Years since quitting: 16.2  . Smokeless tobacco: Never Used  Substance and Sexual Activity  . Alcohol use: No  . Drug use: No  . Sexual activity: Not on file

## 2018-08-20 ENCOUNTER — Ambulatory Visit (INDEPENDENT_AMBULATORY_CARE_PROVIDER_SITE_OTHER): Payer: BLUE CROSS/BLUE SHIELD | Admitting: Orthopaedic Surgery

## 2018-08-26 ENCOUNTER — Telehealth (INDEPENDENT_AMBULATORY_CARE_PROVIDER_SITE_OTHER): Payer: Self-pay | Admitting: Orthopaedic Surgery

## 2018-08-26 NOTE — Telephone Encounter (Signed)
Patient needs to pick up hand xrays to take with her to appt with Dr. Merlyn Lot today. Patient would like to pick cd up at 12:45p today, if possible. Please call patient is there are any questions, or concerns.

## 2018-10-26 ENCOUNTER — Encounter (INDEPENDENT_AMBULATORY_CARE_PROVIDER_SITE_OTHER): Payer: Self-pay | Admitting: Orthopaedic Surgery

## 2018-10-26 ENCOUNTER — Ambulatory Visit (INDEPENDENT_AMBULATORY_CARE_PROVIDER_SITE_OTHER): Payer: BLUE CROSS/BLUE SHIELD | Admitting: Orthopaedic Surgery

## 2018-10-26 ENCOUNTER — Ambulatory Visit (INDEPENDENT_AMBULATORY_CARE_PROVIDER_SITE_OTHER): Payer: Self-pay

## 2018-10-26 VITALS — BP 133/94 | HR 93 | Ht 67.0 in | Wt 145.0 lb

## 2018-10-26 DIAGNOSIS — M79675 Pain in left toe(s): Secondary | ICD-10-CM | POA: Diagnosis not present

## 2018-10-26 DIAGNOSIS — M25572 Pain in left ankle and joints of left foot: Secondary | ICD-10-CM | POA: Diagnosis not present

## 2018-10-26 DIAGNOSIS — M79676 Pain in unspecified toe(s): Secondary | ICD-10-CM

## 2018-10-26 MED ORDER — METHYLPREDNISOLONE ACETATE 40 MG/ML IJ SUSP
20.0000 mg | INTRAMUSCULAR | Status: AC | PRN
  Administered 2018-10-26: 20 mg via INTRA_ARTICULAR

## 2018-10-26 MED ORDER — LIDOCAINE HCL 1 % IJ SOLN
1.0000 mL | INTRAMUSCULAR | Status: AC | PRN
  Administered 2018-10-26: 1 mL

## 2018-10-26 NOTE — Progress Notes (Signed)
Office Visit Note   Patient: Regina Dedra SkeensMichele [name]           Date of Birth: 09/04/1971           MRN: [id number] Visit Date: 10/26/2018              Requested by: Verlee MonteWeeks, Susan R, NP [address] [place], [place] [id number] PCP: Verlee MonteWeeks, Susan R, NP   Assessment & Plan: Visit Diagnoses:  1. 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  Will inject with cortisone and obtain serum uric acid.  Much better after injection  Follow-Up Instructions: No follow-ups on file.   Orders:  Orders Placed This Encounter  Procedures  . XR Foot Complete Left   No orders of the defined types were placed in this encounter.     Procedures: Small Joint Inj: L great MTP on 10/26/2018 12:28 PM Details: 27 G needle, dorsal approach  Spinal Needle: No  Medications: 1 mL lidocaine 1 %; 20 mg methylPREDNISolone acetate 40 MG/ML      Clinical Data: No additional findings.   Subjective: Chief Complaint  Patient presents with  . Lt great toe    Pt stated sharp pain--sharp pain when putting pressure on it, no injury. Tried advil.  Insidious onset of left great toe pain within the last 7 to 10 days.  No history of injury or trauma.  No fever chills redness or swelling.  Positive family history of gout.  No other joint complaints.  No rashes  HPI  Review of Systems  Constitutional: Negative.   HENT: Negative.   Eyes: Negative.   Respiratory: Negative.   Cardiovascular: Negative.   Gastrointestinal: Negative.   Endocrine: Negative.   Genitourinary: Negative.   Musculoskeletal: Positive for gait problem.  Allergic/Immunologic: Negative.   Hematological: Negative.   Psychiatric/Behavioral: Negative.      Objective: Vital Signs: There were no vitals taken for this visit.  Physical Exam Constitutional:      Appearance: She is well-developed.  Eyes:     Pupils: Pupils are equal, round, and reactive to light.  Pulmonary:     Effort: Pulmonary effort  is normal.  Skin:    General: Skin is warm and dry.  Neurological:     Mental Status: She is alert and oriented to person, place, and time.  Psychiatric:        Behavior: Behavior normal.     Ortho Exam awake alert and oriented x3.  Comfortable sitting.  Pain diffusely about the metatarsal phalangeal joint of great toe.  No erythema or ecchymosis or swelling.  Some pain with flexion and extension.  No IP joint pain.  Nail intact.  Neurovascular exam intact  Specialty Comments:  No specialty comments available.  Imaging: No results found.   PMFS History: Patient Active Problem List   Diagnosis Date Noted  . Primary osteoarthritis, right hand 04/09/2018  . Unilateral primary osteoarthritis, left knee 05/21/2017  . Osteoarthritis of left patellofemoral joint 05/21/2017   Past Medical History:  Diagnosis Date  . Fibromyositis     No family history on file.  Past Surgical History:  Procedure Laterality Date  . BREAST ENHANCEMENT SURGERY    . CESAREAN SECTION    . GASTRIC BYPASS    . KNEE ARTHROSCOPY    . TUBAL LIGATION    . TUBAL LIGATION     Social History   Occupational History  . Not on file  Tobacco Use  .  Smoking status: Former Smoker    Packs/day: 0.10    Years: 3.00    Pack years: 0.30    Types: Cigarettes    Last attempt to quit: 05/21/2002    Years since quitting: 16.4  . Smokeless tobacco: Never Used  Substance and Sexual Activity  . Alcohol use: No  . Drug use: No  . Sexual activity: Not on file

## 2018-10-27 ENCOUNTER — Other Ambulatory Visit (INDEPENDENT_AMBULATORY_CARE_PROVIDER_SITE_OTHER): Payer: Self-pay | Admitting: *Deleted

## 2018-10-27 ENCOUNTER — Other Ambulatory Visit (INDEPENDENT_AMBULATORY_CARE_PROVIDER_SITE_OTHER): Payer: Self-pay | Admitting: Orthopaedic Surgery

## 2018-10-27 ENCOUNTER — Encounter (INDEPENDENT_AMBULATORY_CARE_PROVIDER_SITE_OTHER): Payer: Self-pay | Admitting: Orthopaedic Surgery

## 2018-10-27 LAB — URIC ACID: URIC ACID, SERUM: 4.6 mg/dL (ref 2.5–7.0)

## 2018-10-27 MED ORDER — HYDROCODONE-ACETAMINOPHEN 5-325 MG PO TABS: 1.0000 | ORAL_TABLET | Freq: Four times a day (QID) | ORAL | 0 refills | Status: DC | PRN

## 2018-10-27 NOTE — Telephone Encounter (Signed)
Sent in hydrocodone-please check to be sure she is not allergic to this med

## 2018-10-27 NOTE — Telephone Encounter (Signed)
Tramadol 50 mg #30 1-2 tabs po tid prn

## 2018-12-07 ENCOUNTER — Ambulatory Visit (INDEPENDENT_AMBULATORY_CARE_PROVIDER_SITE_OTHER): Payer: BLUE CROSS/BLUE SHIELD | Admitting: Orthopaedic Surgery

## 2018-12-07 ENCOUNTER — Encounter (INDEPENDENT_AMBULATORY_CARE_PROVIDER_SITE_OTHER): Payer: Self-pay | Admitting: Orthopaedic Surgery

## 2018-12-07 VITALS — BP 141/91 | HR 82 | Ht 67.5 in | Wt 150.0 lb

## 2018-12-07 DIAGNOSIS — M79641 Pain in right hand: Secondary | ICD-10-CM

## 2018-12-07 DIAGNOSIS — M19041 Primary osteoarthritis, right hand: Secondary | ICD-10-CM

## 2018-12-07 MED ORDER — LIDOCAINE HCL 1 % IJ SOLN
1.0000 mL | INTRAMUSCULAR | Status: AC | PRN
  Administered 2018-12-07: 1 mL

## 2018-12-07 MED ORDER — METHYLPREDNISOLONE ACETATE 40 MG/ML IJ SUSP
20.0000 mg | INTRAMUSCULAR | Status: AC | PRN
  Administered 2018-12-07: 20 mg via INTRA_ARTICULAR

## 2018-12-07 NOTE — Progress Notes (Signed)
Office Visit Note   Patient: Regina Rollins           Date of Birth: 07-30-1971           MRN: [id number] Visit Date: 12/07/2018              Requested by: Verlee Monte, NP [address] [place], [place] [address] PCP: Verlee Monte, NP   Assessment & Plan: Visit Diagnoses:  1. Pain of right hand   2. Primary osteoarthritis, right hand     Plan: Cortisone injection base of right thumb at the carpometacarpal joint and see back as needed  Follow-Up Instructions: Return if symptoms worsen or fail to improve.   Orders:  Orders Placed This Encounter  Procedures  . Small Joint Inj: R thumb CMC   No orders of the defined types were placed in this encounter.     Procedures: Small Joint Inj: R thumb CMC on 12/07/2018 3:48 PM Details: 27 G needle, dorsal approach  Spinal Needle: No  Medications: 1 mL lidocaine 1 %; 20 mg methylPREDNISolone acetate 40 MG/ML      Clinical Data: No additional findings.   Subjective: Chief Complaint  Patient presents with  . Right Hand - Pain  Patient presents today for follow up of right thumb pain. She was here four months ago and received a cortisone injection in her thumb. Patient states that the injection lasted about 45months. She said that it is hurting again. She is not taking anything for pain. She wants to get another cortisone injection today. She did see a hand surgeon since her last visit, but there was nothing offered that she can found relief with. Has tried NSAIDs which give her some temporary relief.  She did see Dr. Merlyn Lot who wanted her to try some other conservative treatment rather than proceed with surgery.  She is just frustrated with her pain and would like another cortisone injection  HPI  Review of Systems   Objective: Vital Signs: BP (!) 141/91   Pulse 82   Ht 5' 7.5" (1.715 m)   Wt 150 lb (68 kg)   BMI 23.15 kg/m   Physical Exam  Ortho Exam right thumb with some local tenderness dorsally at the  carpal metacarpal joint.  No obvious subluxation or deformity.  Skin intact neurologically intact.  No swelling.  Minimally positive grind test  Specialty Comments:  No specialty comments available.  Imaging: No results found.   PMFS History: Patient Active Problem List   Diagnosis Date Noted  . Toe joint pain, left 10/26/2018  . Primary osteoarthritis, right hand 04/09/2018  . Unilateral primary osteoarthritis, left knee 05/21/2017  . Osteoarthritis of left patellofemoral joint 05/21/2017   Past Medical History:  Diagnosis Date  . Fibromyositis     History reviewed. No pertinent family history.  Past Surgical History:  Procedure Laterality Date  . BREAST ENHANCEMENT SURGERY    . CESAREAN SECTION    . GASTRIC BYPASS    . KNEE ARTHROSCOPY    . TUBAL LIGATION    . TUBAL LIGATION     Social History   Occupational History  . Not on file  Tobacco Use  . Smoking status: Former Smoker    Packs/day: 0.10    Years: 3.00    Pack years: 0.30    Types: Cigarettes    Last attempt to quit: 05/21/2002    Years since quitting: 16.5  . Smokeless tobacco: Never Used  Substance and Sexual Activity  .  Alcohol use: No  . Drug use: No  . Sexual activity: Not on file

## 2019-01-19 ENCOUNTER — Telehealth (INDEPENDENT_AMBULATORY_CARE_PROVIDER_SITE_OTHER): Payer: Self-pay | Admitting: Orthopaedic Surgery

## 2019-01-19 NOTE — Telephone Encounter (Signed)
Called and spoke with patient. Recommended to use ice and compression wrap until her appointment on Friday, per Dr.Whitfield.

## 2019-01-19 NOTE — Telephone Encounter (Signed)
Patient called stating her left kneecap shifted and now has "jello like swelling" on the left side of her knee.  Patient scheduled an appointment with Dr. Cleophas Dunker for Friday, 01/21/19.  Patient states she has been elevating her knee, but doesn't know if she should use ice or heat, and if she should apply a compression wrap for the swelling.  Patient requested a return call.

## 2019-01-22 ENCOUNTER — Ambulatory Visit (INDEPENDENT_AMBULATORY_CARE_PROVIDER_SITE_OTHER): Payer: BLUE CROSS/BLUE SHIELD | Admitting: Orthopaedic Surgery

## 2019-01-22 ENCOUNTER — Other Ambulatory Visit: Payer: Self-pay

## 2019-01-22 ENCOUNTER — Encounter (INDEPENDENT_AMBULATORY_CARE_PROVIDER_SITE_OTHER): Payer: Self-pay | Admitting: Orthopaedic Surgery

## 2019-01-22 ENCOUNTER — Ambulatory Visit (INDEPENDENT_AMBULATORY_CARE_PROVIDER_SITE_OTHER): Payer: BLUE CROSS/BLUE SHIELD

## 2019-01-22 VITALS — BP 136/88 | HR 81 | Ht 67.0 in | Wt 149.0 lb

## 2019-01-22 DIAGNOSIS — M25462 Effusion, left knee: Secondary | ICD-10-CM | POA: Diagnosis not present

## 2019-01-22 DIAGNOSIS — M25562 Pain in left knee: Secondary | ICD-10-CM

## 2019-01-22 DIAGNOSIS — S83002A Unspecified subluxation of left patella, initial encounter: Secondary | ICD-10-CM

## 2019-01-22 MED ORDER — BUPIVACAINE HCL 0.5 % IJ SOLN
2.0000 mL | INTRAMUSCULAR | Status: AC | PRN
  Administered 2019-01-22: 2 mL via INTRA_ARTICULAR

## 2019-01-22 MED ORDER — METHYLPREDNISOLONE ACETATE 40 MG/ML IJ SUSP
80.0000 mg | INTRAMUSCULAR | Status: AC | PRN
  Administered 2019-01-22: 80 mg via INTRA_ARTICULAR

## 2019-01-22 MED ORDER — LIDOCAINE HCL 1 % IJ SOLN
2.0000 mL | INTRAMUSCULAR | Status: AC | PRN
  Administered 2019-01-22: 2 mL

## 2019-01-22 NOTE — Progress Notes (Signed)
Office Visit Note   Patient: Regina Rollins           Date of Birth: 1970-10-31           MRN: [id number] Visit Date: 01/22/2019              Requested by: Verlee Monte, NP [address] [place], [place] [address] PCP: Verlee Monte, NP   Assessment & Plan: Visit Diagnoses:  1. Acute pain of left knee   2. Subluxation of left patella, initial encounter   3. Effusion, left knee     Plan:  #1: Aspiration of approximately 35 cc of clear yellow fluid was obtained without difficulty. #2: 2 cc of Depo-Medrol was placed. #3: Patient was tried with a Shields brace  Follow-Up Instructions: Return in about 2 weeks (around 02/05/2019).   Orders:  Orders Placed This Encounter  Procedures  . Anaerobic and Aerobic Culture  . XR KNEE 3 VIEW LEFT  . Synovial cell count + diff, w/ crystals   No orders of the defined types were placed in this encounter.     Procedures: Large Joint Inj: L knee on 01/22/2019 10:39 AM Indications: pain and diagnostic evaluation Details: 25 G 1.5 in needle, anteromedial approach  Arthrogram: No  Medications: 2 mL lidocaine 1 %; 2 mL bupivacaine 0.5 %; 80 mg methylPREDNISolone acetate 40 MG/ML Aspirate: 30 mL yellow and clear; sent for lab analysis Outcome: tolerated well, no immediate complications Procedure, treatment alternatives, risks and benefits explained, specific risks discussed. Consent was given by the patient. Patient was prepped and draped in the usual sterile fashion.       Clinical Data: No additional findings.   Subjective: Chief Complaint  Patient presents with  . Left Knee - Pain  Patient presents today with left knee pain. She said that she started having swelling in her knee 3 days ago. She bent down and she felt her patella move. She is having pain on the superior lateral side of her knee and radiates around. The pain just recently started.  She has some swelling above her knee as well. She is icing her knee,  elevating, and has been wrapping her knee with an ACE.   HPI  Regina Rollins is a very pleasant 48 year old who states approximately 3 days ago she was trying to bend down and flex the left knee and felt as if her patella had moved to the lateral aspect.  Since that time she is been having pain in the superior lateral aspect of her knee which radiates into the distal thigh and proximal tibia.  Noted marked effusion by history.  And has even use an Ace wrap.  She unfortunately continues to have pain and discomfort is seen today for evaluation.   Review of Systems  Constitutional: Negative for fatigue.  HENT: Negative for ear pain.   Eyes: Negative for pain.  Respiratory: Negative for shortness of breath.   Cardiovascular: Negative for leg swelling.  Gastrointestinal: Negative for constipation and diarrhea.  Endocrine: Positive for cold intolerance. Negative for heat intolerance.  Genitourinary: Negative for difficulty urinating.  Musculoskeletal: Positive for joint swelling.  Skin: Negative for rash.  Allergic/Immunologic: Negative for food allergies.  Neurological: Positive for weakness.  Hematological: Does not bruise/bleed easily.  Psychiatric/Behavioral: Negative for sleep disturbance.     Objective: Vital Signs: BP 136/88   Pulse 81   Ht 5\' 7"  (1.702 m)   Wt 149 lb (67.6 kg)   BMI 23.34 kg/m  Physical Exam Constitutional:      Appearance: Normal appearance. She is well-developed and normal weight.  HENT:     Head: Normocephalic.  Eyes:     Pupils: Pupils are equal, round, and reactive to light.  Pulmonary:     Effort: Pulmonary effort is normal.  Skin:    General: Skin is warm and dry.  Neurological:     Mental Status: She is alert and oriented to person, place, and time.  Psychiatric:        Behavior: Behavior normal.     Ortho Exam  Exam today reveals a moderate effusion.  No warmth or erythema.  She is tender parapatellar area of the knee.  Particular painful in  the proximal tibial or distal femur area.  She can range from full extension to about 90-95 degrees.  She does have pain with subluxation of the patella.  Some patellofemoral crepitance but this is difficult to assess because of her apprehension.  Neurovascular intact distally.  Good motion of her hip without pain or discomfort.  Specialty Comments:  No specialty comments available.  Imaging: Xr Knee 3 View Left  Result Date: 01/22/2019 Three-view x-ray of the left knee reveals patellofemoral spurring especially noted on the sunrise view.  Lateral spur is noted on the patella.  She does have some mild spurring of the medial femoral condyle on sunrise also.  AP reveals some sclerosis of the medial tibial plateau.  There is also medial positioning of the patella on the standing view.    PMFS History: Current Outpatient Medications  Medication Sig Dispense Refill  . ALPRAZolam (XANAX) 1 MG tablet Take 1 mg by mouth daily.    Marland Kitchen amphetamine-dextroamphetamine (ADDERALL) 20 MG tablet Take by mouth.    . ARIPiprazole (ABILIFY) 15 MG tablet Take 15 mg by mouth daily.    . cyclobenzaprine (FLEXERIL) 10 MG tablet Take by mouth.    . gabapentin (NEURONTIN) 100 MG capsule Take 100 mg by mouth every 12 (twelve) hours as needed.    Marland Kitchen HYDROcodone-acetaminophen (NORCO/VICODIN) 5-325 MG tablet Take 1 tablet by mouth every 6 (six) hours as needed for moderate pain. 30 tablet 0  . minocycline (MINOCIN,DYNACIN) 100 MG capsule Take 100 mg by mouth 2 (two) times daily.    . pramipexole (MIRAPEX) 1 MG tablet Take 1 mg by mouth 3 (three) times daily.    . Topiramate ER (TROKENDI XR) 100 MG CP24 Take by mouth.    . traZODone (DESYREL) 150 MG tablet Take 150 mg by mouth at bedtime.     No current facility-administered medications for this visit.     Patient Active Problem List   Diagnosis Date Noted  . Toe joint pain, left 10/26/2018  . Primary osteoarthritis, right hand 04/09/2018  . Unilateral primary  osteoarthritis, left knee 05/21/2017  . Osteoarthritis of left patellofemoral joint 05/21/2017   Past Medical History:  Diagnosis Date  . Fibromyositis     History reviewed. No pertinent family history.  Past Surgical History:  Procedure Laterality Date  . BREAST ENHANCEMENT SURGERY    . CESAREAN SECTION    . GASTRIC BYPASS    . KNEE ARTHROSCOPY    . TUBAL LIGATION    . TUBAL LIGATION     Social History   Occupational History  . Not on file  Tobacco Use  . Smoking status: Former Smoker    Packs/day: 0.10    Years: 3.00    Pack years: 0.30    Types: Cigarettes  Last attempt to quit: 05/21/2002    Years since quitting: 16.6  . Smokeless tobacco: Never Used  Substance and Sexual Activity  . Alcohol use: No  . Drug use: No  . Sexual activity: Not on file

## 2019-01-25 ENCOUNTER — Encounter (INDEPENDENT_AMBULATORY_CARE_PROVIDER_SITE_OTHER): Payer: Self-pay | Admitting: Orthopaedic Surgery

## 2019-01-28 ENCOUNTER — Encounter (INDEPENDENT_AMBULATORY_CARE_PROVIDER_SITE_OTHER): Payer: Self-pay | Admitting: Orthopaedic Surgery

## 2019-01-28 ENCOUNTER — Other Ambulatory Visit (INDEPENDENT_AMBULATORY_CARE_PROVIDER_SITE_OTHER): Payer: Self-pay | Admitting: *Deleted

## 2019-01-28 DIAGNOSIS — M25562 Pain in left knee: Principal | ICD-10-CM

## 2019-01-28 DIAGNOSIS — G8929 Other chronic pain: Secondary | ICD-10-CM

## 2019-01-28 LAB — ANAEROBIC AND AEROBIC CULTURE
AER RESULT:: NO GROWTH
MICRO NUMBER:: 374043
MICRO NUMBER:: 374044
SPECIMEN QUALITY:: ADEQUATE
SPECIMEN QUALITY:: ADEQUATE

## 2019-01-28 LAB — SYNOVIAL CELL COUNT + DIFF, W/ CRYSTALS
Basophils, %: 0 %
Eosinophils-Synovial: 0 % (ref 0–2)
Lymphocytes-Synovial Fld: 6 % (ref 0–74)
Monocyte/Macrophage: 88 % — ABNORMAL HIGH (ref 0–69)
Neutrophil, Synovial: 6 % (ref 0–24)
Synoviocytes, %: 0 % (ref 0–15)
WBC, Synovial: 208 cells/uL — ABNORMAL HIGH (ref <150)

## 2019-02-08 ENCOUNTER — Encounter (INDEPENDENT_AMBULATORY_CARE_PROVIDER_SITE_OTHER): Payer: Self-pay | Admitting: *Deleted

## 2019-02-10 ENCOUNTER — Other Ambulatory Visit: Payer: Self-pay

## 2019-02-10 ENCOUNTER — Ambulatory Visit
Admission: RE | Admit: 2019-02-10 | Discharge: 2019-02-10 | Disposition: A | Discharge status: Home / Self Care | Payer: BLUE CROSS/BLUE SHIELD | Source: Ambulatory Visit | Attending: Orthopaedic Surgery | Admitting: Orthopaedic Surgery

## 2019-02-10 DIAGNOSIS — M25562 Pain in left knee: Principal | ICD-10-CM

## 2019-02-10 DIAGNOSIS — G8929 Other chronic pain: Secondary | ICD-10-CM

## 2019-02-16 ENCOUNTER — Encounter (INDEPENDENT_AMBULATORY_CARE_PROVIDER_SITE_OTHER): Payer: Self-pay | Admitting: Orthopaedic Surgery

## 2019-02-16 ENCOUNTER — Other Ambulatory Visit: Payer: Self-pay

## 2019-02-16 ENCOUNTER — Ambulatory Visit (INDEPENDENT_AMBULATORY_CARE_PROVIDER_SITE_OTHER): Payer: BLUE CROSS/BLUE SHIELD | Admitting: Orthopaedic Surgery

## 2019-02-16 VITALS — BP 129/85 | HR 75 | Ht 67.5 in | Wt 145.0 lb

## 2019-02-16 DIAGNOSIS — M19041 Primary osteoarthritis, right hand: Secondary | ICD-10-CM | POA: Diagnosis not present

## 2019-02-16 DIAGNOSIS — M1712 Unilateral primary osteoarthritis, left knee: Secondary | ICD-10-CM

## 2019-02-16 NOTE — Progress Notes (Signed)
Office Visit Note   Patient: Regina Rollins           Date of Birth: [birth year]           MRN: [id number] Visit Date: 02/16/2019              Requested by: Verlee Monte, NP [address] [place], [place] [address] PCP: Verlee Monte, NP   Assessment & Plan: Visit Diagnoses:  1. Unilateral primary osteoarthritis, left knee   2. Primary osteoarthritis, right hand     Plan: MRI of left knee demonstrates full-thickness cartilage loss of the lateral patella facet associated with subchondral reactive marrow edema.  There is partial Agnes cartilage loss of the lateral compartment and full-thickness cartilage fissuring of the lateral tibial plateau.  No meniscal or ligamentous injuries.  Minimal arthritis in the medial compartment.  Long discussion regarding her knee what she can expect over time.  She does wear a brace.  Does have meloxicam at home but upsets her stomach.  Presently she is not "doing too badly" and will work on exercises.  She does have Voltaren gel.  Also has been followed for the arthritis at the base of the right thumb.  She had an injection several months ago that helped "about 75%" has a splint.  Has long history of fibromyalgia which I really think causes a lot of her discomfort.  She has been to a pain clinic in [place] and is on medicine that "helps".  We will plan to see back as needed.  Might want to consider another injection of thumb in her knee within the next month if she still has symptoms  Follow-Up Instructions: Return if symptoms worsen or fail to improve.   Orders:  No orders of the defined types were placed in this encounter.  No orders of the defined types were placed in this encounter.     Procedures: No procedures performed   Clinical Data: No additional findings.   Subjective: Chief Complaint  Patient presents with  . Left Knee - Follow-up  Patient presents today for follow up on her left knee. She had an MRI on her left  knee on 02/10/19 and is here today for the results. She said that her knee has improved some. She is not taking anything for pain.  Long history of fibromyalgia treated through different physicians offices.  Has been to a pain clinic in [place] and is on a medicine similar to gabapentin.  "This helps".  Does have recurrent symptoms referable to her right hand.  Presently doing well with her left knee.  Has had a prior evaluation for autoimmune disease that was negative.  Takes occasional Vicodin for her pain.  Not having multiple joint arthralgias  HPI  Review of Systems   Objective: Vital Signs: BP 129/85   Pulse 75   Ht 5' 7.5" (1.715 m)   Wt 145 lb (65.8 kg)   BMI 22.38 kg/m   Physical Exam Constitutional:      Appearance: She is well-developed.  Eyes:     Pupils: Pupils are equal, round, and reactive to light.  Pulmonary:     Effort: Pulmonary effort is normal.  Skin:    General: Skin is warm and dry.  Neurological:     Mental Status: She is alert and oriented to person, place, and time.  Psychiatric:        Behavior: Behavior normal.     Ortho Exam left knee was not effused but  there was considerable patellar crepitation.  Full extension of flexion over 105 degrees without instability.  No significant medial lateral joint pain.  No distal edema.  Straight leg raise negative.  No pain with range of motion of either hip.  Right hand evaluation is unchanged.  There is some pain at the base of the thumb with a negative grind test.  No evidence of carpal tunnel.  Finkelstein's test.  Able to make a full fist and release.  Able to touch thumb to little finger  Specialty Comments:  No specialty comments available.  Imaging: No results found.   PMFS History: Patient Active Problem List   Diagnosis Date Noted  . Toe joint pain, left 10/26/2018  . Primary osteoarthritis, right hand 04/09/2018  . Unilateral primary osteoarthritis, left knee 05/21/2017  . Osteoarthritis  of left patellofemoral joint 05/21/2017   Past Medical History:  Diagnosis Date  . Fibromyositis     History reviewed. No pertinent family history.  Past Surgical History:  Procedure Laterality Date  . BREAST ENHANCEMENT SURGERY    . CESAREAN SECTION    . GASTRIC BYPASS    . KNEE ARTHROSCOPY    . TUBAL LIGATION    . TUBAL LIGATION     Social History   Occupational History  . Not on file  Tobacco Use  . Smoking status: Former Smoker    Packs/day: 0.10    Years: 3.00    Pack years: 0.30    Types: Cigarettes    Last attempt to quit: 05/21/2002    Years since quitting: 16.7  . Smokeless tobacco: Never Used  Substance and Sexual Activity  . Alcohol use: No  . Drug use: No  . Sexual activity: Not on file

## 2019-03-16 ENCOUNTER — Encounter: Payer: Self-pay | Admitting: Orthopaedic Surgery

## 2019-03-16 ENCOUNTER — Ambulatory Visit: Payer: Self-pay

## 2019-03-16 ENCOUNTER — Ambulatory Visit: Payer: BLUE CROSS/BLUE SHIELD | Admitting: Orthopaedic Surgery

## 2019-03-16 ENCOUNTER — Other Ambulatory Visit: Payer: Self-pay

## 2019-03-16 VITALS — BP 124/87 | HR 80 | Ht 67.5 in | Wt 145.0 lb

## 2019-03-16 DIAGNOSIS — M19041 Primary osteoarthritis, right hand: Secondary | ICD-10-CM

## 2019-03-16 DIAGNOSIS — M25841 Other specified joint disorders, right hand: Secondary | ICD-10-CM | POA: Diagnosis not present

## 2019-03-16 MED ORDER — METHYLPREDNISOLONE ACETATE 40 MG/ML IJ SUSP
30.0000 mg | INTRAMUSCULAR | Status: AC | PRN
  Administered 2019-03-16: 30 mg via INTRA_ARTICULAR

## 2019-03-16 MED ORDER — BUPIVACAINE HCL 0.25 % IJ SOLN
0.6600 mL | INTRAMUSCULAR | Status: AC | PRN
  Administered 2019-03-16: .66 mL via INTRA_ARTICULAR

## 2019-03-16 MED ORDER — LIDOCAINE HCL 1 % IJ SOLN
0.5000 mL | INTRAMUSCULAR | Status: AC | PRN
  Administered 2019-03-16: .5 mL

## 2019-03-16 NOTE — Progress Notes (Signed)
Office Visit Note   Patient: Regina Dedra SkeensMichele [name]           Date of Birth: 09/27/1971           MRN: [id number] Visit Date: 03/16/2019              Requested by: Verlee MonteWeeks, Susan R, NP [address] [place], [place] [id number] PCP: Verlee MonteWeeks, Susan R, NP   Assessment & Plan: Visit Diagnoses:  1. Primary osteoarthritis, right hand   2. Cyst of joint of right hand     Plan:  [facility] by Dr. Cleophas DunkerWhitfield. #2: Obtain MRI scan of the right wrist  Follow-Up Instructions: Return in about 2 weeks (around 03/30/2019).   Orders:  Orders Placed This Encounter  Procedures   Hand/UE Inj   Small Joint Inj   Medium Joint Inj   Hand/UE Inj   Small Joint Inj   Medium Joint Inj   XR Hand Complete Right   No orders of the defined types were placed in this encounter.     Procedures: Medium Joint Inj: R radiocarpal on 03/16/2019 9:28 AM Details: 25 G 1.5 in needle, dorsal approach Medications: 0.5 mL lidocaine 1 %; 0.66 mL bupivacaine 0.25 %; 30 mg methylPREDNISolone acetate 40 MG/ML Outcome: tolerated well, no immediate complications  Injected 1[facility] by Dr Cleophas DunkerWhitfield Procedure, treatment alternatives, risks and benefits explained, specific risks discussed. Consent was given by the patient. Immediately prior to procedure a time out was called to verify the correct patient, procedure, equipment, support staff and site/side marked as required. Patient was prepped and draped in the usual sterile fashion.       Clinical Data: No additional findings.   Subjective: Chief Complaint  Patient presents with   Right Hand - Follow-up   HPI: Patient presents for follow up on her right hand. She [facility] She is wanting another cortisone injection today. She said that she has noticed a cyst between her fourth and fifth metacarpals that appeared a couple days ago. She said that it is uncomfortable.  Patient has a history of fibromyalgia and has been waking up with pain in her back. She just wanted to mention it because that's unusual, and wanted to know if there was something she should be checked for.    Review of Systems  Constitutional: Negative for fatigue.  HENT: Negative for ear pain.   Eyes: Negative for pain.  Respiratory: Negative for shortness of breath.   Cardiovascular: Negative for leg swelling.  Gastrointestinal: Negative for constipation and diarrhea.  Endocrine: Positive for cold intolerance. Negative for heat intolerance.  Genitourinary: Negative for difficulty urinating.  Musculoskeletal: Positive for joint swelling.  Skin: Negative for rash.  Allergic/Immunologic: Negative for food allergies.  Neurological: Positive for weakness.  Hematological: Does not bruise/bleed easily.  Psychiatric/Behavioral: Negative for sleep disturbance.     Objective: Vital Signs: BP 124/87    Pulse 80    Ht 5' 7.5" (1.715 m)    Wt 145 lb (65.8 kg)    BMI 22.38 kg/m   Physical Exam Constitutional:      Appearance: She is well-developed.  Eyes:     Pupils: Pupils are equal, round, and reactive to light.  Pulmonary:     Effort: Pulmonary effort is normal.  Skin:    General: Skin is warm and dry.  Neurological:     Mental Status: She is alert and  oriented to person, place, and time.  Psychiatric:        Behavior: Behavior normal.     Ortho Exam  Exam today was revealing a nodular superficial mass in between the fourth and fifth metacarpals distally were not particularly painful to palpation.  No warmth erythema.  This is mobile.  She does have a positive grind test [facility].  Neurovascular intact.  Specialty Comments:  No specialty comments available.  Imaging: Xr Hand Complete Right  Result Date: 03/16/2019 Three-view x-ray of the right hand and wrist reveals some first [facility] joint arthritis.    PMFS History: Current Outpatient Medications    Medication Sig Dispense Refill   ALPRAZolam (XANAX) 1 MG tablet Take 1 mg by mouth daily.     amphetamine-dextroamphetamine (ADDERALL) 20 MG tablet Take by mouth.     ARIPiprazole (ABILIFY) 15 MG tablet Take 15 mg by mouth daily.     cyclobenzaprine (FLEXERIL) 10 MG tablet Take by mouth.     gabapentin (NEURONTIN) 100 MG capsule Take 100 mg by mouth every 12 (twelve) hours as needed.     HYDROcodone-acetaminophen (NORCO/VICODIN) 5-325 MG tablet Take 1 tablet by mouth every 6 (six) hours as needed for moderate pain. 30 tablet 0   minocycline (MINOCIN,DYNACIN) 100 MG capsule Take 100 mg by mouth 2 (two) times daily.     pramipexole (MIRAPEX) 1 MG tablet Take 1 mg by mouth 3 (three) times daily.     Topiramate ER (TROKENDI XR) 100 MG CP24 Take by mouth.     traZODone (DESYREL) 150 MG tablet Take 150 mg by mouth at bedtime.     No current facility-administered medications for this visit.     Patient Active Problem List   Diagnosis Date Noted   Toe joint pain, left 10/26/2018   Primary osteoarthritis, right hand 04/09/2018   Unilateral primary osteoarthritis, left knee 05/21/2017   Osteoarthritis of left patellofemoral joint 05/21/2017   Past Medical History:  Diagnosis Date   Fibromyositis     History reviewed. No pertinent family history.  Past Surgical History:  Procedure Laterality Date   BREAST ENHANCEMENT SURGERY     CESAREAN SECTION     GASTRIC BYPASS     KNEE ARTHROSCOPY     TUBAL LIGATION     TUBAL LIGATION     Social History   Occupational History   Not on file  Tobacco Use   Smoking status: Former Smoker    Packs/day: 0.10    Years: 3.00    Pack years: 0.30    Types: Cigarettes    Last attempt to quit: 05/21/2002    Years since quitting: 16.8   Smokeless tobacco: Never Used  Substance and Sexual Activity   Alcohol use: No   Drug use: No   Sexual activity: Not on file

## 2019-03-19 ENCOUNTER — Other Ambulatory Visit: Payer: Self-pay | Admitting: Orthopedic Surgery

## 2019-03-19 DIAGNOSIS — M19041 Primary osteoarthritis, right hand: Secondary | ICD-10-CM

## 2019-03-19 DIAGNOSIS — M25841 Other specified joint disorders, right hand: Secondary | ICD-10-CM

## 2019-03-26 ENCOUNTER — Encounter: Payer: BLUE CROSS/BLUE SHIELD | Admitting: Obstetrics and Gynecology

## 2019-04-15 ENCOUNTER — Ambulatory Visit
Admission: RE | Admit: 2019-04-15 | Discharge: 2019-04-15 | Disposition: A | Discharge status: Home / Self Care | Payer: BLUE CROSS/BLUE SHIELD | Source: Ambulatory Visit | Attending: Orthopedic Surgery | Admitting: Orthopedic Surgery

## 2019-04-15 ENCOUNTER — Other Ambulatory Visit: Payer: Self-pay

## 2019-04-15 DIAGNOSIS — M25841 Other specified joint disorders, right hand: Secondary | ICD-10-CM

## 2019-04-15 DIAGNOSIS — M19041 Primary osteoarthritis, right hand: Secondary | ICD-10-CM

## 2019-04-15 MED ORDER — IOPAMIDOL (ISOVUE-M 200) INJECTION 41%
2.0000 mL | Freq: Once | INTRAMUSCULAR | Status: AC
  Administered 2019-04-15: 2 mL via INTRA_ARTICULAR

## 2019-04-16 ENCOUNTER — Emergency Department (INDEPENDENT_AMBULATORY_CARE_PROVIDER_SITE_OTHER)
Admission: EM | Admit: 2019-04-16 | Discharge: 2019-04-16 | Disposition: A | Discharge status: Home / Self Care | Payer: BC Managed Care – PPO | Source: Home / Self Care

## 2019-04-16 ENCOUNTER — Emergency Department (INDEPENDENT_AMBULATORY_CARE_PROVIDER_SITE_OTHER): Payer: BC Managed Care – PPO

## 2019-04-16 ENCOUNTER — Other Ambulatory Visit: Payer: Self-pay

## 2019-04-16 DIAGNOSIS — R0789 Other chest pain: Secondary | ICD-10-CM

## 2019-04-16 DIAGNOSIS — R0781 Pleurodynia: Secondary | ICD-10-CM

## 2019-04-16 DIAGNOSIS — R091 Pleurisy: Secondary | ICD-10-CM

## 2019-04-16 NOTE — ED Provider Notes (Signed)
[facility]    CSN: [id number] Arrival date & time: 04/16/19  0930     History   Chief Complaint Chief Complaint  Patient presents with  . Chest Pain    HPI Jeffie Pollock is a 48 y.o. female.   HPI Jeffie Pollock is a 48 y.o. female presenting to UC with c/o Left sided chest pain gradually worsening over the last 3 weeks after attempting to move a 600lb headboard about 3 weeks ago. Pt states a corner of the headboard fell into her chest but did not knock her over. Pain is aching and sore but sharp with certain movements and deep inspiration. She has not tried anything for pain. Denies other symptoms including no URI symptoms- cough, congestion, fever, chills, n/v/d.  Pt has surgery scheduled next week for removal of hemorrhoids. During a pre-op phone triage visit yesterday, pt was advised to go to the hospital for a CXR to make sure she did not have a collapsed lung or other emergent/serious cause for her continued she pain.     History reviewed. No pertinent past medical history.  There are no active problems to display for this patient.   Past Surgical History:  Procedure Laterality Date  . BRAIN SURGERY      OB History   No obstetric history on file.      Home Medications    Prior to Admission medications   Medication Sig Start Date End Date Taking? Authorizing Provider  amphetamine-dextroamphetamine (ADDERALL) 10 MG tablet Take 10 mg by mouth daily with breakfast.   Yes [provider]    Family History History reviewed. No pertinent family history.  Social History Social History   Tobacco Use  . Smoking status: Not on file  Substance Use Topics  . Alcohol use: Not on file  . Drug use: Not on file     Allergies   Sulfa antibiotics and Penicillins   Review of Systems Review of Systems  Constitutional: Negative for chills, diaphoresis, fatigue and fever.  Respiratory: Negative for cough, chest tightness, shortness of  breath and wheezing.   Cardiovascular: Positive for chest pain. Negative for palpitations.  Gastrointestinal: Negative for abdominal pain.  Musculoskeletal: Positive for back pain (mild) and myalgias.  Skin: Negative for color change and wound.     Physical Exam Triage Vital Signs ED Triage Vitals  Enc Vitals Group     BP 04/16/19 1009 126/84     Pulse Rate 04/16/19 1009 80     Resp 04/16/19 1009 20     Temp 04/16/19 1009 98.1 F (36.7 C)     Temp Source 04/16/19 1009 Oral     SpO2 04/16/19 1009 100 %     Weight 04/16/19 1017 150 lb (68 kg)     Height 04/16/19 1017 5\' 7"  (1.702 m)     Head Circumference --      Peak Flow --      Pain Score 04/16/19 1016 4     Pain Loc --      Pain Edu? --      Excl. in [place]? --    No data found.  Updated Vital Signs BP 126/84 (BP Location: Right Arm)   Pulse 80   Temp 98.1 F (36.7 C) (Oral)   Resp 20   Ht 5\' 7"  (1.702 m)   Wt 150 lb (68 kg)   SpO2 100%   BMI 23.49 kg/m   Visual Acuity Right Eye Distance:   Left  Eye Distance:   Bilateral Distance:    Right Eye Near:   Left Eye Near:    Bilateral Near:     Physical Exam Vitals signs and nursing note reviewed.  Constitutional:      Appearance: She is well-developed.  HENT:     Head: Normocephalic and atraumatic.  Neck:     Musculoskeletal: Normal range of motion.  Cardiovascular:     Rate and Rhythm: Normal rate and regular rhythm.  Pulmonary:     Effort: Pulmonary effort is normal.     Breath sounds: No decreased breath sounds, wheezing, rhonchi or rales.  Chest:     Chest wall: Tenderness present. No deformity, swelling or crepitus.    Musculoskeletal: Normal range of motion.  Skin:    General: Skin is warm and dry.  Neurological:     Mental Status: She is alert and oriented to person, place, and time.  Psychiatric:        Behavior: Behavior normal.      UC Treatments / Results  Labs (all labs ordered are listed, but only abnormal results are displayed)  Labs Reviewed - No data to display  EKG None  Radiology Dg Ribs Unilateral W/chest Left  Result Date: 04/16/2019 CLINICAL DATA:  Heavy object fell on chest EXAM: LEFT RIBS AND CHEST - 3+ VIEW COMPARISON:  None. FINDINGS: Frontal chest as well as oblique and cone-down rib images were obtained. Lungs are clear. The heart size and pulmonary vascularity are normal. No adenopathy. No pneumothorax or pleural effusion. No rib fracture evident. IMPRESSION: No rib fracture evident.  Lungs clear. Electronically Signed   By: Bretta BangWilliam  Woodruff III M.D.   On: 04/16/2019 10:27    Procedures Procedures (including critical care time)  Medications Ordered in UC Medications - No data to display  Initial Impression / Assessment and Plan / UC Course  I have reviewed the triage vital signs and the nursing notes.  Pertinent labs & imaging results that were available during my care of the patient were reviewed by me and considered in my medical decision making (see chart for details).     Reassured pt of normal CXR Encouraged conservative tx at home F/u with PCP as needed.  Final Clinical Impressions(s) / UC Diagnoses   Final diagnoses:  Left-sided chest wall pain  Pleurisy     Discharge Instructions      Your chest x-ray looked normal. There was no sign of broken/fractured ribs or a collapsed lung.  You may alternate cool and warm compresses to help with pain.  It is recommended that you take 500mg  acetaminophen every 4-6 hours or in combination with ibuprofen 400-600mg  every 6-8 hours as needed for pain and inflammation.    Due to upcoming surgery, it is recommended you ask your surgeon about when to stop taking certain medications such as NSAIDs (ibuprofen, naproxen) prior to your surgery as these can increase bleeding during the surgery.  Please follow up with your family doctor next week if not improving.  Call 911 or go to the hospital if symptoms worsening.      ED  Prescriptions    None     Controlled Substance Prescriptions [place] Controlled Substance Registry consulted? Not Applicable   Rolla Platehelps, Rachard Isidro O, PA-C 04/16/19 1143

## 2019-04-16 NOTE — ED Triage Notes (Signed)
Pt was moving a headboard about 3 weeks ago and it fell into her upper left chest.  Has had upper left chest pain since.  It is painful to take a deep breath, painful to sit up in bed, and twisting from side to side.

## 2019-04-16 NOTE — Discharge Instructions (Signed)
°  Your chest x-ray looked normal. There was no sign of broken/fractured ribs or a collapsed lung.  You may alternate cool and warm compresses to help with pain.  It is recommended that you take 500mg  acetaminophen every 4-6 hours or in combination with ibuprofen 400-600mg  every 6-8 hours as needed for pain and inflammation.    Due to upcoming surgery, it is recommended you ask your surgeon about when to stop taking certain medications such as NSAIDs (ibuprofen, naproxen) prior to your surgery as these can increase bleeding during the surgery.  Please follow up with your family doctor next week if not improving.  Call 911 or go to the hospital if symptoms worsening.

## 2019-04-19 ENCOUNTER — Encounter: Payer: Self-pay | Admitting: Orthopaedic Surgery

## 2019-04-20 ENCOUNTER — Other Ambulatory Visit: Payer: Self-pay | Admitting: Orthopaedic Surgery

## 2019-04-20 DIAGNOSIS — M19041 Primary osteoarthritis, right hand: Secondary | ICD-10-CM

## 2019-04-20 NOTE — Telephone Encounter (Signed)
Please make her an appt to see Dr Burney Gauze...thanks

## 2019-08-12 ENCOUNTER — Other Ambulatory Visit: Payer: Self-pay

## 2019-08-12 ENCOUNTER — Encounter: Payer: Self-pay | Admitting: Orthopaedic Surgery

## 2019-08-12 ENCOUNTER — Ambulatory Visit (INDEPENDENT_AMBULATORY_CARE_PROVIDER_SITE_OTHER): Payer: BC Managed Care – PPO | Admitting: Orthopaedic Surgery

## 2019-08-12 ENCOUNTER — Ambulatory Visit: Payer: Self-pay

## 2019-08-12 ENCOUNTER — Ambulatory Visit: Payer: BC Managed Care – PPO | Admitting: Rheumatology

## 2019-08-12 VITALS — BP 127/85 | HR 84 | Ht 67.0 in | Wt 150.0 lb

## 2019-08-12 DIAGNOSIS — M19041 Primary osteoarthritis, right hand: Secondary | ICD-10-CM

## 2019-08-12 DIAGNOSIS — M79641 Pain in right hand: Secondary | ICD-10-CM | POA: Diagnosis not present

## 2019-08-12 MED ORDER — METHYLPREDNISOLONE ACETATE 40 MG/ML IJ SUSP
20.0000 mg | INTRAMUSCULAR | Status: AC | PRN
  Administered 2019-08-12: 14:00:00 20 mg

## 2019-08-12 MED ORDER — LIDOCAINE HCL 1 % IJ SOLN
1.0000 mL | INTRAMUSCULAR | Status: AC | PRN
  Administered 2019-08-12: 1 mL

## 2019-08-12 NOTE — Progress Notes (Signed)
Office Visit Note   Patient: Regina Rollins           Date of Birth: 04-May-1971           MRN: [id number] Visit Date: 08/12/2019              Requested by: Verlee Monte, NP [address] [place],  [place] [address] PCP: Verlee Monte, NP   Assessment & Plan: Visit Diagnoses:  1. Pain in right hand   2. Primary osteoarthritis, right hand     Plan:Regina has an established diagnosis of arthritis of the base of the right thumb.  She has had good response to cortisone in the past so I will reinjected.  She is also having some pain in the dorsum of her wrist.  She had an MRI scan with contrast in June demonstrating some mild degenerative changes in the carpus but no other abnormality.  That could be the cause of her problem and I will injected area of tenderness.  X-rays were negative for any abnormality about the wrist today other than minimal negative ulnar variance Follow-Up Instructions: No follow-ups on file.   Orders:  Orders Placed This Encounter  Procedures  . Hand/UE Inj  . Hand/UE Inj  . XR Hand Complete Right   No orders of the defined types were placed in this encounter.     Procedures: Hand/UE Inj for osteoarthritis on 08/12/2019 1:57 PM Indications: pain Details: dorsal approach Medications: 1 mL lidocaine 1 %; 20 mg methylPREDNISolone acetate 40 MG/ML  Injected to the base of the right thumb  Hand/UE Inj on 08/12/2019 1:58 PM Indications: pain Details: dorsal approach Medications: 1 mL lidocaine 1 %; 20 mg methylPREDNISolone acetate 40 MG/ML  Injected into the dorsal right wrist at level of tenderness      Clinical Data: No additional findings.   Subjective: Chief Complaint  Patient presents with  . Right Hand - Follow-up  Patient presents today with recurrent right hand pain. She was last evaluated five months ago and received a cortisone injection in her right 1st [facility] joint. Patient states that she has a "hardened area" and "snaps" at  her wrist. She has pain in both areas now. She is wanting cortisone injections today.   HPI  Review of Systems   Objective: Vital Signs: BP 127/85   Pulse 84   Ht 5\' 7"  (1.702 m)   Wt 150 lb (68 kg)   BMI 23.49 kg/m   Physical Exam Constitutional:      Appearance: She is well-developed.  Eyes:     Pupils: Pupils are equal, round, and reactive to light.  Pulmonary:     Effort: Pulmonary effort is normal.  Skin:    General: Skin is warm and dry.  Neurological:     Mental Status: She is alert and oriented to person, place, and time.  Psychiatric:        Behavior: Behavior normal.     Ortho Exam awake alert and oriented x3.  Comfortable sitting.  Right hand without any swelling.  There is an area of point tenderness at the mid dorsal carpus i.e. radiocarpal joint without mass formation.  There is also some positive grind test and pain at the base of the thumb where she has had prior diagnosis of osteoarthritis by MRI scan.  Neurologically intact.  No volar wrist pain.  Tinel's and Phalen's negative over the median nerve.  Good opposition of thumb to little finger.  No dorsal edema.  Extensor tendons intact full range of motion of wrist and fingers  Specialty Comments:  No specialty comments available.  Imaging: Xr Hand Complete Right  Result Date: 08/12/2019 Films of the right hand were obtained in several projections.  There is slight ulnar negative variance.  There are mild degenerative changes at the thumb carpometacarpal joint.  No ectopic calcification.  No acute changes.    PMFS History: Patient Active Problem List   Diagnosis Date Noted  . Toe joint pain, left 10/26/2018  . Primary osteoarthritis, right hand 04/09/2018  . Unilateral primary osteoarthritis, left knee 05/21/2017  . Osteoarthritis of left patellofemoral joint 05/21/2017   Past Medical History:  Diagnosis Date  . Fibromyositis     History reviewed. No pertinent family history.  Past Surgical  History:  Procedure Laterality Date  . BRAIN SURGERY    . BREAST ENHANCEMENT SURGERY    . CESAREAN SECTION    . GASTRIC BYPASS    . KNEE ARTHROSCOPY    . TUBAL LIGATION    . TUBAL LIGATION     Social History   Occupational History  . Not on file  Tobacco Use  . Smoking status: Former Smoker    Packs/day: 0.10    Years: 3.00    Pack years: 0.30    Types: Cigarettes    Quit date: 05/21/2002    Years since quitting: 17.2  . Smokeless tobacco: Never Used  Substance and Sexual Activity  . Alcohol use: No  . Drug use: No  . Sexual activity: Not on file

## 2019-11-04 IMAGING — XA FLUORO GUIDED NEEDLE PLACEMENT AND/OR ASPIRATION
2 series · 2 of 2 positions shown · non-contrast
Comparison: none

CLINICAL DATA: Right wrist pain.  Osteoarthritis.  Cyst.

[Series 2: ortho standard · 1 of 1 slices shown (1 of 2)]
[im 1/1]
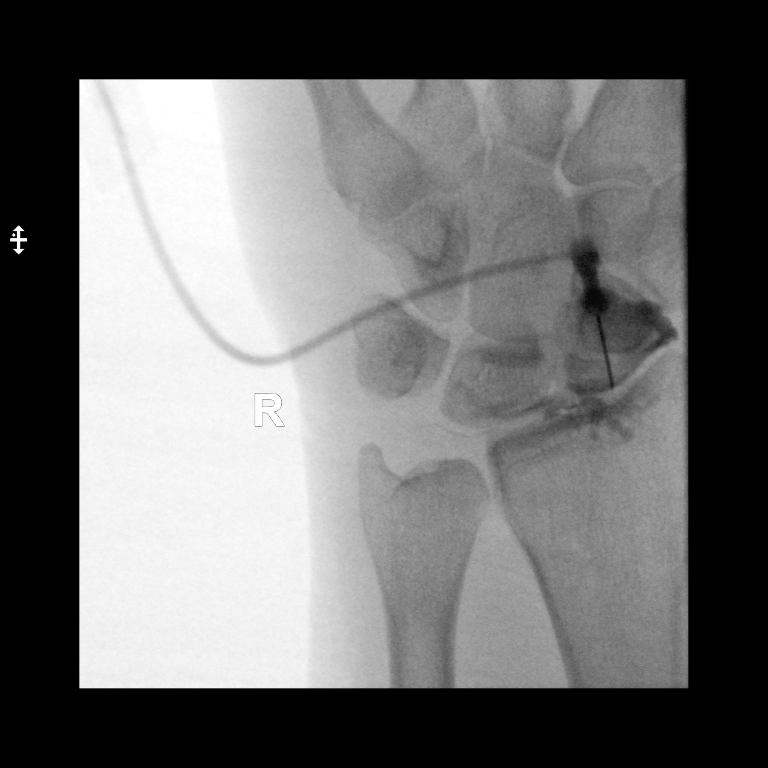

[Series 3: ortho standard · 1 of 1 slices shown (2 of 2)]
[im 1/1]
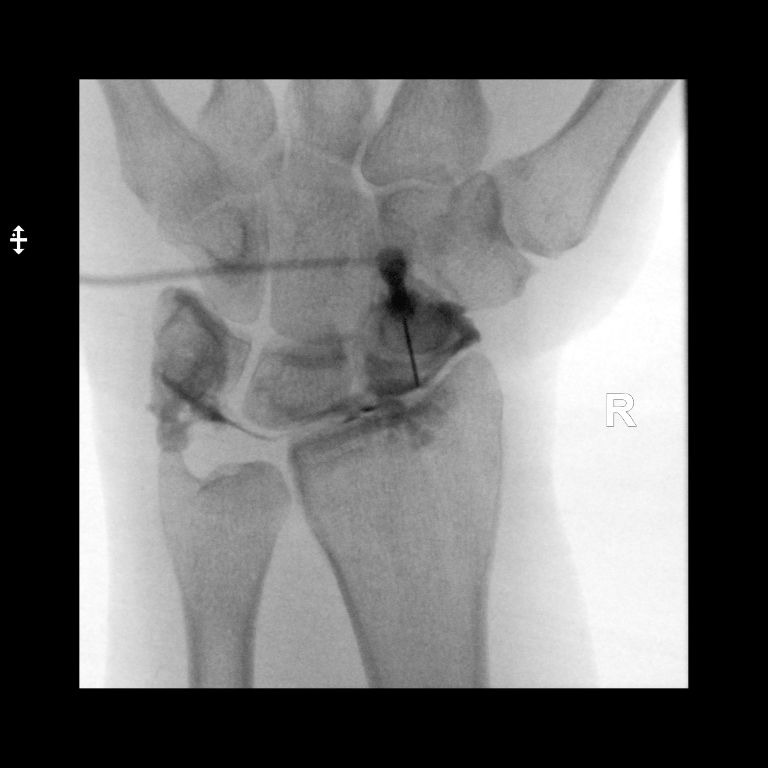

[2 of 2 positions shown; findings below may reference images not displayed]

FLUOROSCOPY TIME:  Fluoroscopy Time: 3 seconds

Radiation Exposure Index: 1.50 microGray*m^2

PROCEDURE:
RIGHT WRIST INJECTION UNDER FLUOROSCOPY

An appropriate skin entrance site was determined. The site was
marked, prepped with Betadine, draped in the usual sterile fashion,
and infiltrated locally with 1% Lidocaine. A 25 gauge skin needle
was advanced into the radiocarpal joint under intermittent
fluoroscopy. A mixture of 0.1 mL of MultiHance, 10 mL of Isovue-M
200, and 10 mL of sterile saline was then used to opacify the
proximal carpal joint. 1.5 mL of this mixture was injected. No
immediate complication.
IMPRESSION: Technically successful right wrist injection for MRI.

## 2019-11-04 IMAGING — MR MRI OF THE RIGHT WRIST WITH CONTRAST
7 series · 40 of 40 positions shown · IV contrast (agent unspecified)
Comparison: None.

CLINICAL DATA: Right wrist pain and swelling. Pain at the thumb
joint.

EXAM:
MRI OF THE RIGHT WRIST WITH CONTRAST (MR Arthrogram)
TECHNIQUE: Multiplanar, multisequence MR imaging of the wrist was performed
immediately following contrast injection into the radiocarpal joint
under fluoroscopic guidance. No intravenous contrast was
administered.

[Series 3: T1 fat-sat · axial · 3.0mm · 0.20mm/px · z∈[-37,+30]mm · 6 of 20 slices shown]
[im 1/20]
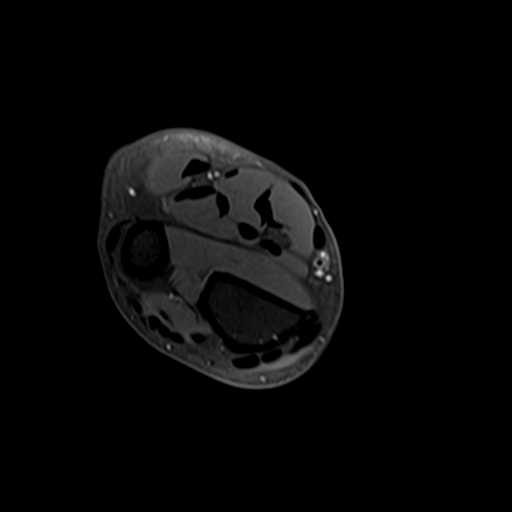
[im 4/20]
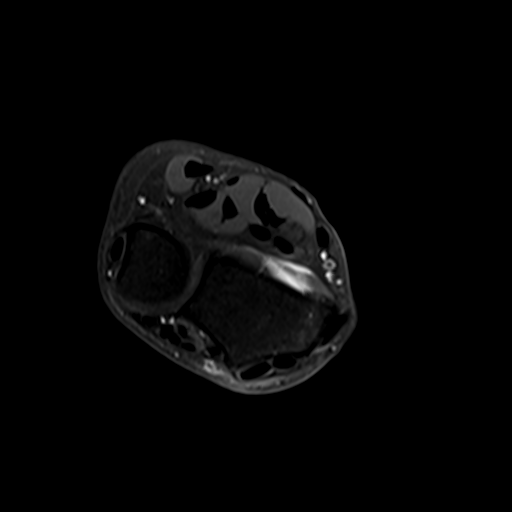
[im 8/20]
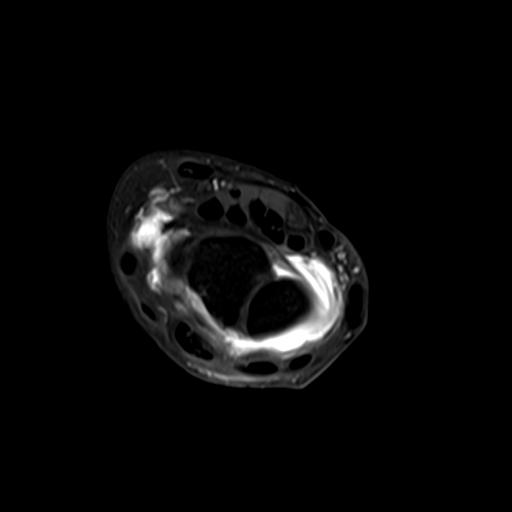
[im 12/20]
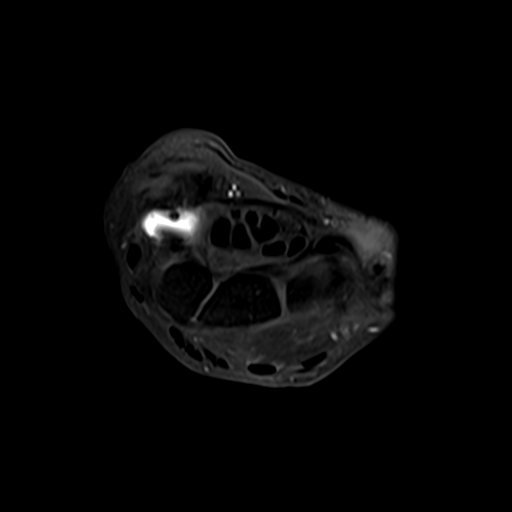
[im 16/20]
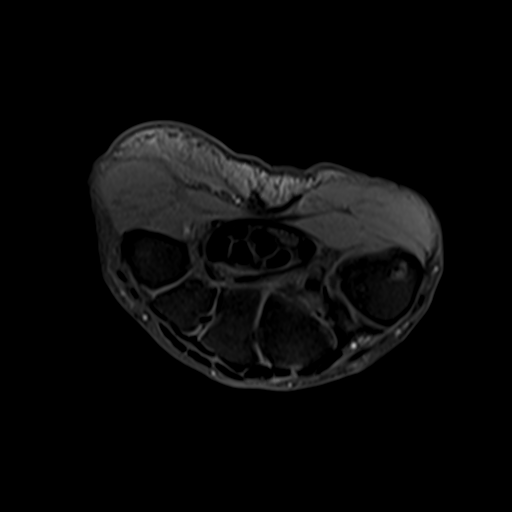
[im 20/20]
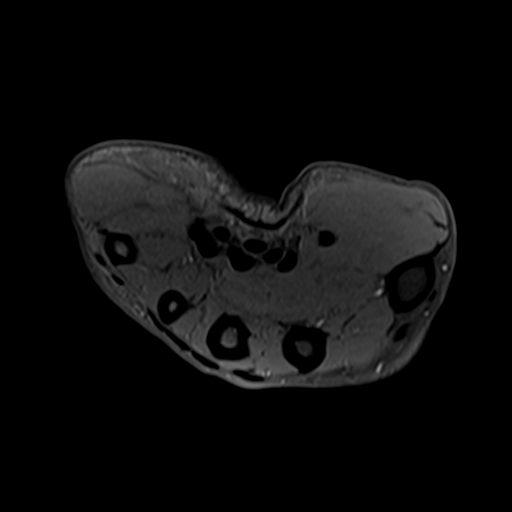

[Series 4: T2 fat-sat · axial · 3.0mm · 0.47mm/px · z∈[-36,+32]mm · 6 of 20 slices shown (1 of 3)]
[im 1/20]
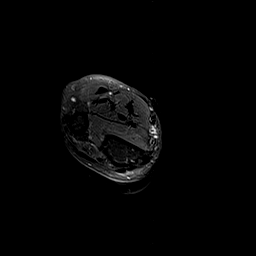
[im 4/20]
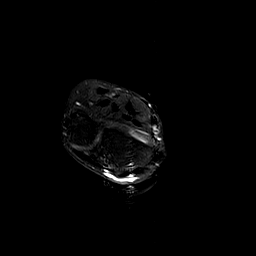
[im 8/20]
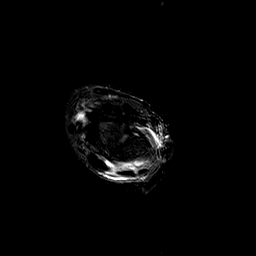
[im 12/20]
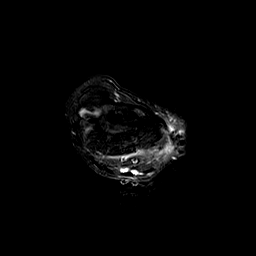
[im 16/20]
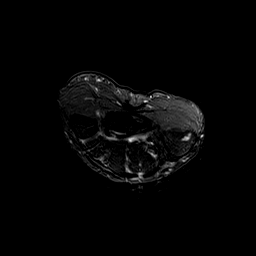
[im 20/20]
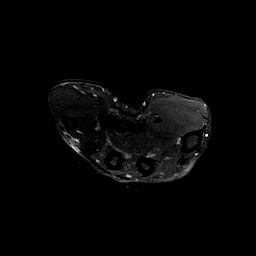

[Series 5: t1_tse_cor_fs · coronal · 3.0mm · 0.39mm/px · 5 of 16 slices shown]
[im 1/16]
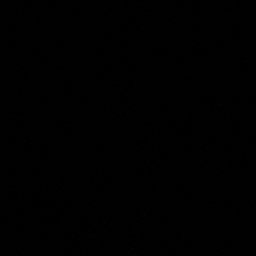
[im 4/16]
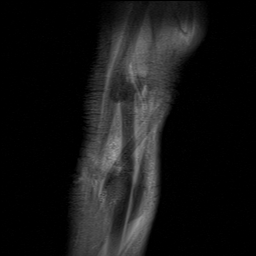
[im 8/16]
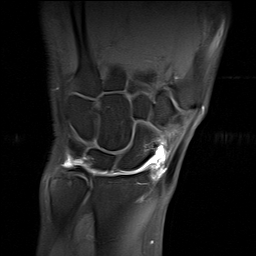
[im 12/16]
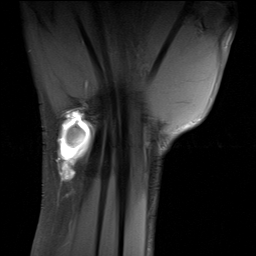
[im 16/16]
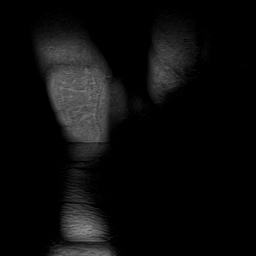

[Series 6: t1_tse_cor_no fs · coronal · 3.0mm · 0.39mm/px · 5 of 16 slices shown]
[im 1/16]
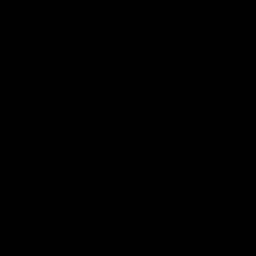
[im 4/16]
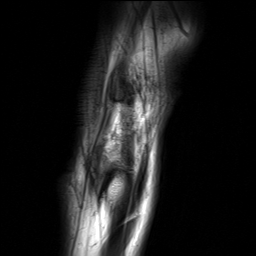
[im 8/16]
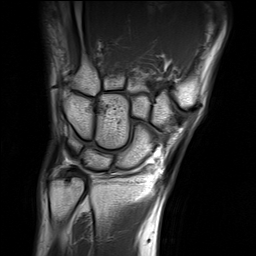
[im 12/16]
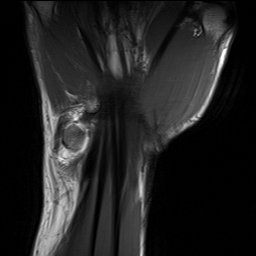
[im 16/16]
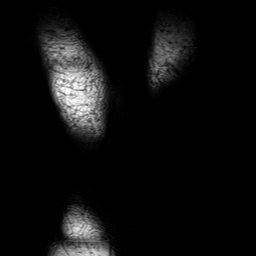

[Series 7: T2 fat-sat · coronal · 3.0mm · 0.39mm/px · 5 of 16 slices shown (2 of 3)]
[im 1/16]
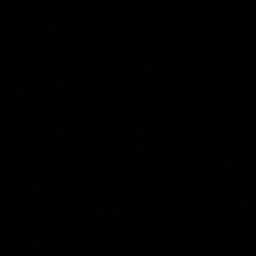
[im 4/16]
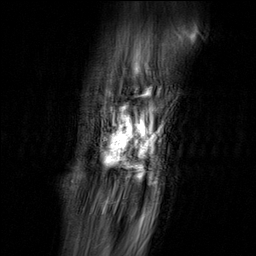
[im 8/16]
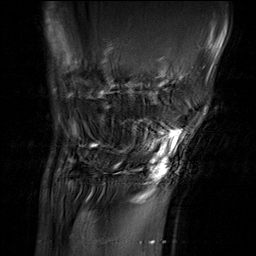
[im 12/16]
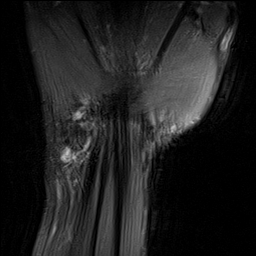
[im 16/16]
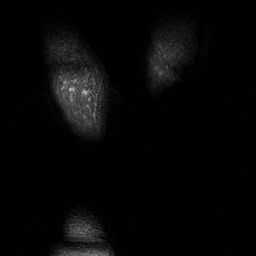

[Series 8: t1_tse_sag_fs · sagittal · 3.0mm · 0.39mm/px · 8 of 24 slices shown]
[im 1/24]
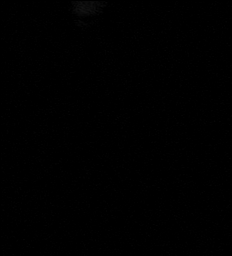
[im 4/24]
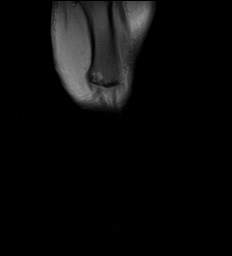
[im 7/24]
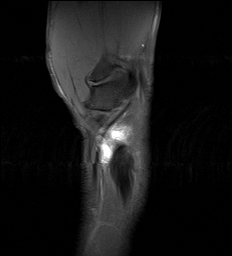
[im 10/24]
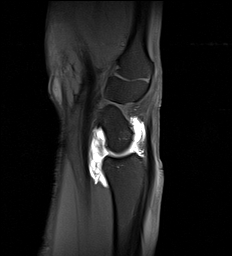
[im 14/24]
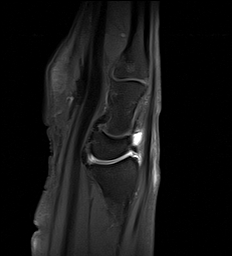
[im 17/24]
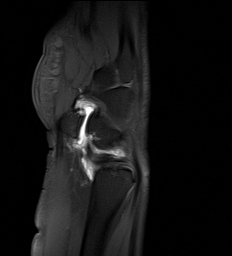
[im 20/24]
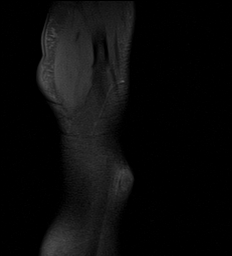
[im 24/24]
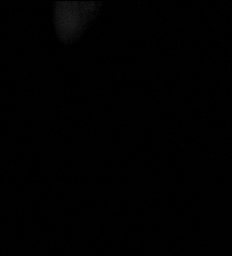

[Series 9: T2 fat-sat · coronal · 3.0mm · 0.39mm/px · 5 of 16 slices shown (3 of 3)]
[im 1/16]
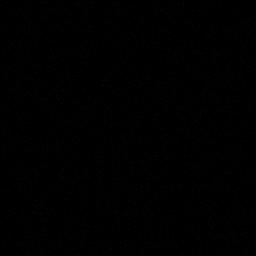
[im 4/16]
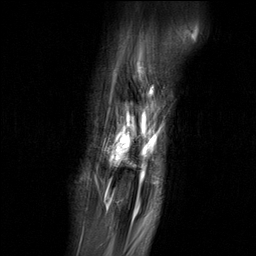
[im 8/16]
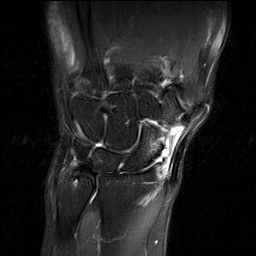
[im 12/16]
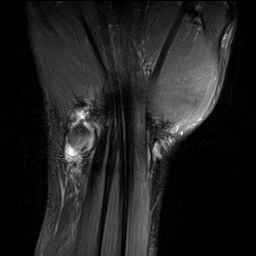
[im 16/16]
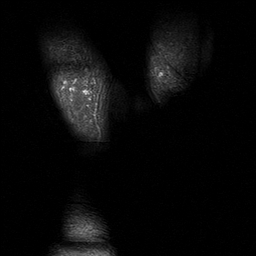

[40 of 40 positions shown; findings below may reference images not displayed]

FINDINGS: Patient motion degrades image quality limiting evaluation.

Ligaments: Scapholunate ligament and lunotriquetral ligaments are
intact.

Triangular fibrocartilage: Intact.

Tendons: Flexor and extensor compartment tendons are grossly intact.

Carpal tunnel/median nerve: Normal carpal tunnel. Normal median
nerve.

Guyon's canal: Normal.

Joint/cartilage: Intraarticular contrast in the radiocarpal joint.
No intra-articular loose body. No midcarpal joint effusion. No
distal radioulnar joint effusion. Partial-thickness cartilage loss
of the scaphotrapeziotrapezoid joint with subchondral reactive
marrow edema in the distal scaphoid and base of the trapezium.
Partial-thickness cartilage loss of the first CMC joint with
subchondral reactive marrow changes.

Bones/carpal alignment: No acute osseous abnormality. No aggressive
osseous lesion. Normal alignment.

Other: No fluid collection or hematoma.
IMPRESSION: 1. Partial-thickness cartilage loss of the scaphotrapeziotrapezoid
joint with subchondral reactive marrow edema in the distal scaphoid
and base of the trapezium.
2. Partial-thickness cartilage loss of the first CMC joint with
subchondral reactive marrow changes.
3.  No acute osseous injury of the right wrist.

## 2019-12-08 ENCOUNTER — Encounter: Payer: Self-pay | Admitting: Orthopaedic Surgery

## 2019-12-08 ENCOUNTER — Other Ambulatory Visit: Payer: Self-pay

## 2019-12-08 ENCOUNTER — Ambulatory Visit: Payer: BC Managed Care – PPO | Admitting: Orthopaedic Surgery

## 2019-12-08 VITALS — Ht 67.0 in | Wt 140.0 lb

## 2019-12-08 DIAGNOSIS — M19041 Primary osteoarthritis, right hand: Secondary | ICD-10-CM

## 2019-12-08 MED ORDER — METHYLPREDNISOLONE ACETATE 40 MG/ML IJ SUSP
20.0000 mg | INTRAMUSCULAR | Status: AC | PRN
  Administered 2019-12-08: 15:00:00 20 mg

## 2019-12-08 MED ORDER — LIDOCAINE HCL 1 % IJ SOLN
0.5000 mL | INTRAMUSCULAR | Status: AC | PRN
  Administered 2019-12-08: 15:00:00 .5 mL

## 2019-12-08 NOTE — Progress Notes (Signed)
Office Visit Note   Patient: Regina Rollins           Date of Birth: Feb 10, 1971           MRN: [id number] Visit Date: 12/08/2019              Requested by: Verlee Monte, NP [address] [place],  [place] [address] PCP: Verlee Monte, NP   Assessment & Plan: Visit Diagnoses:  1. Primary osteoarthritis, right hand     Plan: Recurrent symptoms of arthritis at the base of the right thumb and in the dorsum of the wrist where she previously had x-rays consistent with midcarpal arthritis.  I have injected both areas of tenderness with Xylocaine and Depo-Medrol  Follow-Up Instructions: Return if symptoms worsen or fail to improve.   Orders:  Orders Placed This Encounter  Procedures  . Hand/UE Inj: R thumb CMC   No orders of the defined types were placed in this encounter.     Procedures: Hand/UE Inj: R thumb CMC for osteoarthritis on 12/08/2019 2:33 PM Medications: 0.5 mL lidocaine 1 %; 20 mg methylPREDNISolone acetate 40 MG/ML  I also injected the area of tenderness in the dorsum of her right wrist where she did have some midcarpal arthritis of the milligrams of Depo-Medrol of 1 cc of 1% Xylocaine without epinephrine for both injections      Clinical Data: No additional findings.   Subjective: Chief Complaint  Patient presents with  . Right Hand - Pain  Patient presents today for recurrent right hand pain. She was last evaluated in October of 2020 and received a cortisone injection in her thumb and dorsal wrist area. She started hurting about a week ago and wants to get the same injections again today. She is unable to grasp and twist anything. She does not take anything for pain. The Voltaren gel does not help, but the CBD oil helps some.   HPI  Review of Systems   Objective: Vital Signs: Ht 5\' 7"  (1.702 m)   Wt 140 lb (63.5 kg)   BMI 21.93 kg/m   Physical Exam  Ortho Exam right hand was not hot red warm or swollen.  There are some degenerative  changes at the base of the right thumb at the carpometacarpal junction.  There is partial subluxation and a positive grind test.  Good grip and release.  Also some area of tenderness in the mid dorsum of her wrist near the radiocarpal joint where she had prior x-rays consistent with arthritic changes.  Neurologically intact  Specialty Comments:  No specialty comments available.  Imaging: No results found.   PMFS History: Patient Active Problem List   Diagnosis Date Noted  . Toe joint pain, left 10/26/2018  . Primary osteoarthritis, right hand 04/09/2018  . Unilateral primary osteoarthritis, left knee 05/21/2017  . Osteoarthritis of left patellofemoral joint 05/21/2017   Past Medical History:  Diagnosis Date  . Fibromyositis     History reviewed. No pertinent family history.  Past Surgical History:  Procedure Laterality Date  . BRAIN SURGERY    . BREAST ENHANCEMENT SURGERY    . CESAREAN SECTION    . GASTRIC BYPASS    . KNEE ARTHROSCOPY    . TUBAL LIGATION    . TUBAL LIGATION     Social History   Occupational History  . Not on file  Tobacco Use  . Smoking status: Former Smoker    Packs/day: 0.10    Years: 3.00  Pack years: 0.30    Types: Cigarettes    Quit date: 05/21/2002    Years since quitting: 17.5  . Smokeless tobacco: Never Used  Substance and Sexual Activity  . Alcohol use: No  . Drug use: No  . Sexual activity: Not on file

## 2020-02-03 ENCOUNTER — Telehealth: Payer: Self-pay | Admitting: Orthopaedic Surgery

## 2020-02-03 ENCOUNTER — Encounter: Payer: Self-pay | Admitting: Orthopaedic Surgery

## 2020-02-03 ENCOUNTER — Other Ambulatory Visit: Payer: Self-pay | Admitting: Orthopaedic Surgery

## 2020-02-03 MED ORDER — HYDROCODONE-ACETAMINOPHEN 5-325 MG PO TABS: 1.0000 | ORAL_TABLET | Freq: Four times a day (QID) | ORAL | 0 refills | Status: DC | PRN

## 2020-02-03 NOTE — Telephone Encounter (Signed)
Please advise 

## 2020-02-03 NOTE — Telephone Encounter (Signed)
Patient called stating that her right hand is not any better and now it is hurting in another spot.  She had the injection, but it did not help.  She would like a return phone call to discuss.  SM#[phone].  Thank you.

## 2020-02-03 NOTE — Telephone Encounter (Signed)
Offer an earlier appt or hydrocodone for pain

## 2020-02-03 NOTE — Telephone Encounter (Signed)
I would suggest referral to hand surgeons-if she agrees please refer to Dr Merlyn Lot

## 2020-02-03 NOTE — Telephone Encounter (Signed)
Spoke with patient.  She is already established with Dr.Kuzma and will call them about an appointment. She also wanted pain medicine to hold her over until her appointment Dr.Kuzma. I referred her to speak with Dr.Kuzma about pain medicine since he will be following her for the pain in her hands. She will call us if she needs anything else.

## 2020-02-04 ENCOUNTER — Other Ambulatory Visit: Payer: Self-pay | Admitting: Orthopaedic Surgery

## 2020-02-04 NOTE — Telephone Encounter (Signed)
Rx sent in yesterday for hydrocodone

## 2020-02-08 ENCOUNTER — Other Ambulatory Visit: Payer: Self-pay

## 2020-02-08 ENCOUNTER — Encounter: Payer: Self-pay | Admitting: Orthopaedic Surgery

## 2020-02-08 ENCOUNTER — Ambulatory Visit: Payer: BC Managed Care – PPO | Admitting: Orthopaedic Surgery

## 2020-02-08 VITALS — Ht 67.0 in | Wt 140.0 lb

## 2020-02-08 DIAGNOSIS — M19041 Primary osteoarthritis, right hand: Secondary | ICD-10-CM | POA: Diagnosis not present

## 2020-02-08 MED ORDER — METHYLPREDNISOLONE ACETATE 40 MG/ML IJ SUSP
20.0000 mg | INTRAMUSCULAR | Status: AC | PRN
  Administered 2020-02-08: 10:00:00 20 mg

## 2020-02-08 MED ORDER — LIDOCAINE HCL 1 % IJ SOLN
0.5000 mL | INTRAMUSCULAR | Status: AC | PRN
  Administered 2020-02-08: 10:00:00 .5 mL

## 2020-02-08 NOTE — Progress Notes (Signed)
Office Visit Note   Patient: Regina Rollins           Date of Birth: Jun 04, 1971           MRN: [id number] Visit Date: 02/08/2020              Requested by: Verlee Monte, NP [address] [place],  [place] [address] PCP: Verlee Monte, NP   Assessment & Plan: Visit Diagnoses:  1. Primary osteoarthritis, right hand     Plan: Regina Rollins returns for evaluation of the pain at the base of her right thumb.  Prior films demonstrated some osteoarthritis.  She has responded temporarily to cortisone injections in the past.  She has tried a splint CBD oil and Voltaren gel.  She has been taking hydrocodone.  She has had a difficult time following up with Dr. Merlyn Lot and so we have resubmitted a consult and hopefully they will call her in the near future.  I am not sure what else to do from from my standpoint.  He initially had placed her after his first evaluation on Cymbalta but she is not sure that that is helped.  Her pain is localized along the base of the thumb at the metacarpal carpal joint.  I reinjected her today and will see her back as needed.  She has been tested twice in the past for carpal tunnel symptoms these were performed elsewhere and she notes that they were "normal" on the last exam  Follow-Up Instructions: Return if symptoms worsen or fail to improve.   Orders:  Orders Placed This Encounter  Procedures  . Hand/UE Inj: R thumb CMC  . Ambulatory referral to Orthopedic Surgery   No orders of the defined types were placed in this encounter.     Procedures: Hand/UE Inj: R thumb CMC for osteoarthritis on 02/08/2020 10:28 AM Medications: 0.5 mL lidocaine 1 %; 20 mg methylPREDNISolone acetate 40 MG/ML      Clinical Data: No additional findings.   Subjective: Chief Complaint  Patient presents with  . Right Hand - Pain  Patient presents today for right hand pain. She was here last in February and received a [facility] joint injection. She said that the injection did not  help this past time. She is taking hydrocodone that Dr.Sydnei Ohaver prescribed last week. She states that the medication helps. The pain will wake her at night. The pain is worse with movement or use of her hand. She is right hand dominant. No swelling.  Has had prior injections at the base of the thumb with temporary relief.  We have tried splints, Voltaren gel and even CBD oil.  She is having a difficult time tolerating her pain.  She did see Dr. Merlyn Lot on 1 occasion he placed her on Cymbalta.  She is not sure that that has helped either.  I will reconsult Dr. Merlyn Lot  HPI  Review of Systems   Objective: Vital Signs: Ht 5\' 7"  (1.702 m)   Wt 140 lb (63.5 kg)   BMI 21.93 kg/m   Physical Exam Constitutional:      Appearance: She is well-developed.  Eyes:     Pupils: Pupils are equal, round, and reactive to light.  Pulmonary:     Effort: Pulmonary effort is normal.  Skin:    General: Skin is warm and dry.  Neurological:     Mental Status: She is alert and oriented to person, place, and time.  Psychiatric:        Behavior:  Behavior normal.     Ortho Exam right hand with negative Phalen's and Tinel's over the median nerve.  Good opposition of thumb to little finger with good strength.  Does have some pain at the base of the thumb at the metacarpal carpal joint.  Negative grind test.  No obvious subluxation.  No redness.  Neurologically intact.  Negative de Quervain's testing.  Specialty Comments:  No specialty comments available.  Imaging: No results found.   PMFS History: Patient Active Problem List   Diagnosis Date Noted  . Toe joint pain, left 10/26/2018  . Primary osteoarthritis, right hand 04/09/2018  . Unilateral primary osteoarthritis, left knee 05/21/2017  . Osteoarthritis of left patellofemoral joint 05/21/2017   Past Medical History:  Diagnosis Date  . Fibromyositis     History reviewed. No pertinent family history.  Past Surgical History:  Procedure Laterality  Date  . BRAIN SURGERY    . BREAST ENHANCEMENT SURGERY    . CESAREAN SECTION    . GASTRIC BYPASS    . KNEE ARTHROSCOPY    . TUBAL LIGATION    . TUBAL LIGATION     Social History   Occupational History  . Not on file  Tobacco Use  . Smoking status: Former Smoker    Packs/day: 0.10    Years: 3.00    Pack years: 0.30    Types: Cigarettes    Quit date: 05/21/2002    Years since quitting: 17.7  . Smokeless tobacco: Never Used  Substance and Sexual Activity  . Alcohol use: No  . Drug use: No  . Sexual activity: Not on file

## 2020-02-16 ENCOUNTER — Ambulatory Visit: Payer: BC Managed Care – PPO | Admitting: Orthopaedic Surgery

## 2020-07-17 ENCOUNTER — Telehealth: Payer: Self-pay | Admitting: Orthopaedic Surgery

## 2020-07-17 NOTE — Telephone Encounter (Signed)
Tried to call patient. No answer. Left message that she either needed to follow up with Dr.Kuzma or wait until her appointment with Dr.Whitfield to discuss pain medication. I ask her to call back with any further questions.

## 2020-07-17 NOTE — Telephone Encounter (Signed)
Patient called requesting pain medication for hand. Patient states she need something to relieve hand pains until upcoming appt. Please send to pharmacy on file. Patient phone number is [phone].

## 2020-07-17 NOTE — Telephone Encounter (Signed)
Please advise. Patient saw Dr.Kuzma on 02/21/2020, after seeing you. She was suppose to follow up in 2 months with him for injections, but did not. She instead if scheduled with you for 07-20-20 for injections.

## 2020-07-17 NOTE — Telephone Encounter (Signed)
Will need to wait until appt for any meds or f/u with Dr Merlyn Lot

## 2020-07-20 ENCOUNTER — Encounter: Payer: Self-pay | Admitting: Orthopaedic Surgery

## 2020-07-20 ENCOUNTER — Other Ambulatory Visit: Payer: Self-pay

## 2020-07-20 ENCOUNTER — Ambulatory Visit (INDEPENDENT_AMBULATORY_CARE_PROVIDER_SITE_OTHER): Payer: BLUE CROSS/BLUE SHIELD | Admitting: Orthopaedic Surgery

## 2020-07-20 VITALS — Ht 67.0 in | Wt 140.0 lb

## 2020-07-20 DIAGNOSIS — M19041 Primary osteoarthritis, right hand: Secondary | ICD-10-CM

## 2020-07-20 MED ORDER — METHYLPREDNISOLONE ACETATE 40 MG/ML IJ SUSP
20.0000 mg | INTRAMUSCULAR | Status: AC | PRN
  Administered 2020-07-20: 20 mg

## 2020-07-20 MED ORDER — LIDOCAINE HCL 1 % IJ SOLN
1.0000 mL | INTRAMUSCULAR | Status: AC | PRN
  Administered 2020-07-20: 1 mL

## 2020-07-20 NOTE — Progress Notes (Signed)
Office Visit Note   Patient: Regina Rollins           Date of Birth: [birth year]           MRN: [id number] Visit Date: 07/20/2020              Requested by: Verlee Monte, NP [address] [place],  [place] [address] PCP: Verlee Monte, NP   Assessment & Plan: Visit Diagnoses:  1. Primary osteoarthritis, right hand     Plan: Regina Rollins has recurrent symptoms of osteoarthritis at the base of the right thumb at the carpometacarpal joint.  She also is having some discomfort and what appears to be carpal bossing in the mid carpus.  She has had successful injections in the past.  I will repeat those today.  She does have a basilar thumb splint.  She has seen Dr. Merlyn Lot in regards to surgical consideration but would prefer at this point to continue to pursue nonoperative treatment  Follow-Up Instructions: Return if symptoms worsen or fail to improve.   Orders:  Orders Placed This Encounter  Procedures   Hand/UE Inj: R thumb CMC   No orders of the defined types were placed in this encounter.     Procedures: Hand/UE Inj: R thumb CMC for osteoarthritis on 07/20/2020 4:51 PM Medications: 1 mL lidocaine 1 %; 20 mg methylPREDNISolone acetate 40 MG/ML      Clinical Data: No additional findings.   Subjective: Chief Complaint  Patient presents with   Right Hand - Pain  Patient presents today for recurrent chronic right hand pain, located at the right thumb. She was last here in April and received a cortisone injection at the thumb [facility]. She then went to see Dr.Kuzma. She is here today because the pain has returned and she would like another injection or two. She has started to a new job and uses her hands a lot. She would like a script for Voltaren gel sent in.   HPI  Review of Systems   Objective: Vital Signs: Ht 5\' 7"  (1.702 m)    Wt 140 lb (63.5 kg)    BMI 21.93 kg/m   Physical Exam Constitutional:      Appearance: She is well-developed.  Eyes:     Pupils:  Pupils are equal, round, and reactive to light.  Pulmonary:     Effort: Pulmonary effort is normal.  Skin:    General: Skin is warm and dry.  Neurological:     Mental Status: She is alert and oriented to person, place, and time.  Psychiatric:        Behavior: Behavior normal.     Ortho Exam awake alert and oriented x3.  Comfortable sitting.  There is some partial subluxation at the base of the right thumb carpometacarpal joint with positive grind test and pain.  No swelling.  Neurologically intact.  Also has some prominence of the mid dorsum of the hand that I suspect may be carpal bossing.  No Tinel's no distal edema.  Full grip and release  Specialty Comments:  No specialty comments available.  Imaging: No results found.   PMFS History: Patient Active Problem List   Diagnosis Date Noted   Toe joint pain, left 10/26/2018   Primary osteoarthritis, right hand 04/09/2018   Unilateral primary osteoarthritis, left knee 05/21/2017   Osteoarthritis of left patellofemoral joint 05/21/2017   Past Medical History:  Diagnosis Date   Fibromyositis     History reviewed. No pertinent family  history.  Past Surgical History:  Procedure Laterality Date   BRAIN SURGERY     BREAST ENHANCEMENT SURGERY     CESAREAN SECTION     GASTRIC BYPASS     KNEE ARTHROSCOPY     TUBAL LIGATION     TUBAL LIGATION     Social History   Occupational History   Not on file  Tobacco Use   Smoking status: Former Smoker    Packs/day: 0.10    Years: 3.00    Pack years: 0.30    Types: Cigarettes    Quit date: 05/21/2002    Years since quitting: 18.1   Smokeless tobacco: Never Used  Vaping Use   Vaping Use: Never used  Substance and Sexual Activity   Alcohol use: No   Drug use: No   Sexual activity: Not on file

## 2020-11-28 ENCOUNTER — Encounter: Payer: Self-pay | Admitting: Orthopaedic Surgery

## 2020-11-28 ENCOUNTER — Ambulatory Visit (INDEPENDENT_AMBULATORY_CARE_PROVIDER_SITE_OTHER): Payer: BLUE CROSS/BLUE SHIELD | Admitting: Orthopaedic Surgery

## 2020-11-28 ENCOUNTER — Other Ambulatory Visit: Payer: Self-pay

## 2020-11-28 VITALS — Ht 67.0 in | Wt 140.0 lb

## 2020-11-28 DIAGNOSIS — M19041 Primary osteoarthritis, right hand: Secondary | ICD-10-CM

## 2020-11-28 DIAGNOSIS — M19042 Primary osteoarthritis, left hand: Secondary | ICD-10-CM

## 2020-11-28 NOTE — Progress Notes (Signed)
Office Visit Note   Patient: Regina Rollins           Date of Birth: 07-20-1971           MRN: [id number] Visit Date: 11/28/2020              Requested by: Verlee Monte, NP [address] [place],  [place] [address] PCP: Verlee Monte, NP   Assessment & Plan: Visit Diagnoses:  1. Primary osteoarthritis, right hand   2. Primary osteoarthritis, left hand     Plan: Recurrent symptoms of osteoarthritis base of both thumbs. Has done well with cortisone injections in the past. We will repeat cortisone injections basilar thumb joints both thumbs today  Follow-Up Instructions: Return if symptoms worsen or fail to improve.   Orders:  No orders of the defined types were placed in this encounter.  No orders of the defined types were placed in this encounter.     Procedures: No procedures performed   Clinical Data: No additional findings.   Subjective: Chief Complaint  Patient presents with  . Right Hand - Pain  Patient presents today for recurrent chronic pain in her right hand. She here last on 07/20/2020 and had her hand injected. She is wanting to have her hand injected again today.   HPI  Review of Systems   Objective: Vital Signs: Ht 5\' 7"  (1.702 m)   Wt 140 lb (63.5 kg)   BMI 21.93 kg/m   Physical Exam Constitutional:      Appearance: She is well-developed and well-nourished.  HENT:     Mouth/Throat:     Mouth: Oropharynx is clear and moist.  Eyes:     Extraocular Movements: EOM normal.     Pupils: Pupils are equal, round, and reactive to light.  Pulmonary:     Effort: Pulmonary effort is normal.  Skin:    General: Skin is warm and dry.  Neurological:     Mental Status: She is alert and oriented to person, place, and time.  Psychiatric:        Mood and Affect: Mood and affect normal.        Behavior: Behavior normal.     Ortho Exam awake alert and oriented x3. Comfortable sitting obvious degenerative changes the base of both thumbs with  partial subluxation more on the right than the left. Somewhat of a decreased grip based on the above. Skin intact neurologic intact  Specialty Comments:  No specialty comments available.  Imaging: No results found.   PMFS History: Patient Active Problem List   Diagnosis Date Noted  . Primary osteoarthritis, left hand 11/28/2020  . Toe joint pain, left 10/26/2018  . Primary osteoarthritis, right hand 04/09/2018  . Unilateral primary osteoarthritis, left knee 05/21/2017  . Osteoarthritis of left patellofemoral joint 05/21/2017   Past Medical History:  Diagnosis Date  . Fibromyositis     History reviewed. No pertinent family history.  Past Surgical History:  Procedure Laterality Date  . BRAIN SURGERY    . BREAST ENHANCEMENT SURGERY    . CESAREAN SECTION    . GASTRIC BYPASS    . KNEE ARTHROSCOPY    . TUBAL LIGATION    . TUBAL LIGATION     Social History   Occupational History  . Not on file  Tobacco Use  . Smoking status: Former Smoker    Packs/day: 0.10    Years: 3.00    Pack years: 0.30    Types: Cigarettes  Quit date: 05/21/2002    Years since quitting: 18.5  . Smokeless tobacco: Never Used  Vaping Use  . Vaping Use: Never used  Substance and Sexual Activity  . Alcohol use: No  . Drug use: No  . Sexual activity: Not on file

## 2021-04-12 ENCOUNTER — Ambulatory Visit (INDEPENDENT_AMBULATORY_CARE_PROVIDER_SITE_OTHER): Payer: BLUE CROSS/BLUE SHIELD | Admitting: Orthopaedic Surgery

## 2021-04-12 ENCOUNTER — Encounter: Payer: Self-pay | Admitting: Orthopaedic Surgery

## 2021-04-12 ENCOUNTER — Other Ambulatory Visit: Payer: Self-pay

## 2021-04-12 DIAGNOSIS — M19041 Primary osteoarthritis, right hand: Secondary | ICD-10-CM

## 2021-04-12 DIAGNOSIS — M19042 Primary osteoarthritis, left hand: Secondary | ICD-10-CM

## 2021-04-12 MED ORDER — LIDOCAINE HCL 1 % IJ SOLN
1.0000 mL | INTRAMUSCULAR | Status: AC | PRN
  Administered 2021-04-12: 1 mL

## 2021-04-12 NOTE — Addendum Note (Signed)
Addended by: Wendi Maya on: 04/12/2021 02:13 PM   Modules accepted: Orders

## 2021-04-12 NOTE — Progress Notes (Addendum)
Office Visit Note   Patient: Regina Rollins           Date of Birth: 1970/11/03           MRN: [id number] Visit Date: 04/12/2021              Requested by: Verlee Monte, NP [address] [place],  [place] [address] PCP: Verlee Monte, NP   Assessment & Plan: Visit Diagnoses:  1. Primary osteoarthritis, right hand   2. Primary osteoarthritis, left hand     Plan: Recurrent symptoms of osteoarthritis at the carpometacarpal joint of both thumbs.  Has had good response to cortisone injections in the past.  Will repeat.  Patient would like a referral to a hand surgeon at [facility]  Follow-Up Instructions: Return if symptoms worsen or fail to improve.   Orders:  Orders Placed This Encounter  Procedures   Hand/UE Inj: R thumb CMC   No orders of the defined types were placed in this encounter.     Procedures: Hand/UE Inj: L thumb CMC for osteoarthritis on 04/12/2021 2:03 PM Indications: pain Details: 27 G needle, dorsal approach Medications: 1 mL lidocaine 1 %  3 mg betamethasone injected with Xylocaine into the basal thumb joint left hand     Clinical Data: No additional findings.   Subjective: Chief Complaint  Patient presents with   Right Hand - Follow-up   Left Hand - Follow-up  Patient presents today for chronic bilateral thumb pain. She had both hands injected in February and got good relief. She wants to have both injected again today. Her right hand is worse than the left.  HPI  Review of Systems   Objective: Vital Signs: There were no vitals taken for this visit.  Physical Exam Constitutional:      Appearance: She is well-developed.  Eyes:     Pupils: Pupils are equal, round, and reactive to light.  Pulmonary:     Effort: Pulmonary effort is normal.  Skin:    General: Skin is warm and dry.  Neurological:     Mental Status: She is alert and oriented to person, place, and time.  Psychiatric:        Behavior: Behavior normal.    Ortho Exam  pain and prominence at the base of the thumb joints both thumbs.  Positive grind test.  Some limitation of grip related to her pain.  Normal sensation good capillary refill  Specialty Comments:  No specialty comments available.  Imaging: No results found.   PMFS History: Patient Active Problem List   Diagnosis Date Noted   Primary osteoarthritis, left hand 11/28/2020   Toe joint pain, left 10/26/2018   Primary osteoarthritis, right hand 04/09/2018   Unilateral primary osteoarthritis, left knee 05/21/2017   Osteoarthritis of left patellofemoral joint 05/21/2017   Past Medical History:  Diagnosis Date   Fibromyositis     History reviewed. No pertinent family history.  Past Surgical History:  Procedure Laterality Date   BRAIN SURGERY     BREAST ENHANCEMENT SURGERY     CESAREAN SECTION     GASTRIC BYPASS     KNEE ARTHROSCOPY     TUBAL LIGATION     TUBAL LIGATION     Social History   Occupational History   Not on file  Tobacco Use   Smoking status: Former    Packs/day: 0.10    Years: 3.00    Pack years: 0.30    Types: Cigarettes    Quit  date: 05/21/2002    Years since quitting: 18.9   Smokeless tobacco: Never  Vaping Use   Vaping Use: Never used  Substance and Sexual Activity   Alcohol use: No   Drug use: No   Sexual activity: Not on file

## 2021-04-19 ENCOUNTER — Telehealth: Payer: Self-pay | Admitting: Orthopaedic Surgery

## 2021-04-19 NOTE — Telephone Encounter (Signed)
[employer] called and states patients benefits are out of network for them.   CB [phone]

## 2021-04-20 NOTE — Telephone Encounter (Signed)
Called and spoke with patient. She has a new insurance now. I gave her the number to [employer] that was provided. She will call them and see if they accept her insurance.

## 2021-05-27 ENCOUNTER — Other Ambulatory Visit: Payer: Self-pay

## 2021-05-27 ENCOUNTER — Emergency Department
Admission: EM | Admit: 2021-05-27 | Discharge: 2021-05-27 | Disposition: A | Discharge status: Home / Self Care | Payer: 59 | Attending: Emergency Medicine | Admitting: Emergency Medicine

## 2021-05-27 DIAGNOSIS — R519 Headache, unspecified: Secondary | ICD-10-CM | POA: Diagnosis present

## 2021-05-27 DIAGNOSIS — U071 COVID-19: Secondary | ICD-10-CM | POA: Insufficient documentation

## 2021-05-27 DIAGNOSIS — Z5321 Procedure and treatment not carried out due to patient leaving prior to being seen by health care provider: Secondary | ICD-10-CM | POA: Diagnosis not present

## 2021-05-27 DIAGNOSIS — H40059 Ocular hypertension, unspecified eye: Secondary | ICD-10-CM | POA: Insufficient documentation

## 2021-05-27 DIAGNOSIS — F419 Anxiety disorder, unspecified: Secondary | ICD-10-CM | POA: Insufficient documentation

## 2021-05-27 LAB — RESP PANEL BY RT-PCR (FLU A&B, COVID) ARPGX2
Influenza A by PCR: NEGATIVE
Influenza B by PCR: NEGATIVE
SARS Coronavirus 2 by RT PCR: POSITIVE — AB

## 2021-05-27 MED ORDER — ACETAMINOPHEN 325 MG PO TABS
650.0000 mg | ORAL_TABLET | Freq: Once | ORAL | Status: AC
  Administered 2021-05-27: 650 mg via ORAL
  Filled 2021-05-27: qty 2

## 2021-05-27 NOTE — ED Triage Notes (Signed)
Pt comes ems with eye pressure, headache, spine pain, leg pain, anxiety worsening for about a week. Was sent by eye doctor. Pt states "there is fluids squirting through my head" and everything is hurting. Pt tearful, sensitive to light. Hx of gastic bypass. VSS with EMS.

## 2021-05-27 NOTE — ED Notes (Signed)
Spoke with Dr Vicente Males. Tylenol and a covid swab for now. No blood or CT.

## 2022-02-13 ENCOUNTER — Encounter: Payer: Self-pay | Admitting: Orthopaedic Surgery

## 2022-02-13 ENCOUNTER — Ambulatory Visit (INDEPENDENT_AMBULATORY_CARE_PROVIDER_SITE_OTHER): Payer: BC Managed Care – PPO | Admitting: Orthopaedic Surgery

## 2022-02-13 DIAGNOSIS — M19041 Primary osteoarthritis, right hand: Secondary | ICD-10-CM | POA: Diagnosis not present

## 2022-02-13 MED ORDER — LIDOCAINE HCL 1 % IJ SOLN
0.5000 mL | INTRAMUSCULAR | Status: AC | PRN
  Administered 2022-02-13: .5 mL

## 2022-02-13 MED ORDER — METHYLPREDNISOLONE ACETATE 40 MG/ML IJ SUSP
10.0000 mg | INTRAMUSCULAR | Status: AC | PRN
  Administered 2022-02-13: 10 mg

## 2022-02-13 NOTE — Progress Notes (Signed)
? ?Office Visit Note ?  ?Patient: Regina Rollins           ?Date of Birth: January 09, 1971           ?MRN: [id number] ?Visit Date: 02/13/2022 ?             ?Requested by: Verlee Monte, NP ?[address] ?[place],  [place] [address] ?PCP: Verlee Monte, NP ? ? ?Assessment & Plan: ?Visit Diagnoses:  ?1. Primary osteoarthritis, right hand   ? ? ?Plan: Recurrent pain right thumb carpometacarpal joint with prior diagnosis of osteoarthritis.  Will reinject with Depo-Medrol and referred to Dr. Frazier Butt for consideration of surgery.  Also experiencing discomfort at the right thumb IP joint.  Will inject this as well ? ?Follow-Up Instructions: Return refer to Dr Frazier Butt.  ? ?Orders:  ?No orders of the defined types were placed in this encounter. ? ?No orders of the defined types were placed in this encounter. ? ? ? ? Procedures: ?Hand/UE Inj: R thumb CMC for osteoarthritis on 02/13/2022 9:46 AM ?Indications: pain ?Details: 27 G needle, dorsal approach ?Medications: 0.5 mL lidocaine 1 %; 10 mg methylPREDNISolone acetate 40 MG/ML ? ? ?Hand/UE Inj: R thumb IP for osteoarthritis on 02/13/2022 9:47 AM ?Details: 27 G needle, dorsal approach ?Medications: 0.5 mL lidocaine 1 %; 10 mg methylPREDNISolone acetate 40 MG/ML ? ? ? ? ?Clinical Data: ?No additional findings. ? ? ?Subjective: ?Chief Complaint  ?Patient presents with  ? Left Hand - Pain  ? Right Hand - Pain  ?Chronic history of bilateral hand pain hands, specifically, degenerative arthritis at the carpometacarpal thumb joints.  Having recurrent symptoms.  Has had multiple injections in the past with temporary relief.  Have considered surgery in the past but De"An referred to "wait". ? ?HPI ? ?Review of Systems ? ? ?Objective: ?Vital Signs: There were no vitals taken for this visit. ? ?Physical Exam ?Constitutional:   ?   Appearance: She is well-developed.  ?Eyes:  ?   Pupils: Pupils are equal, round, and reactive to light.  ?Pulmonary:  ?   Effort: Pulmonary effort is normal.   ?Skin: ?   General: Skin is warm and dry.  ?Neurological:  ?   Mental Status: She is alert and oriented to person, place, and time.  ?Psychiatric:     ?   Behavior: Behavior normal.  ? ? ?Ortho Exam awake alert and oriented x3.  Comfortable sitting.  Right hand is causing most of the pain.  There was a positive grind test and some hypertrophic changes at the thumb carpometacarpal joint.  Also having some pain about the IP joint of the right thumb without any hypertrophic changes.  Neurovascular exam intact. ? ?Specialty Comments:  ?No specialty comments available. ? ?Imaging: ?No results found. ? ? ?PMFS History: ?Patient Active Problem List  ? Diagnosis Date Noted  ? Primary osteoarthritis, left hand 11/28/2020  ? Toe joint pain, left 10/26/2018  ? Primary osteoarthritis, right hand 04/09/2018  ? Unilateral primary osteoarthritis, left knee 05/21/2017  ? Osteoarthritis of left patellofemoral joint 05/21/2017  ? ?Past Medical History:  ?Diagnosis Date  ? Fibromyositis   ?  ?History reviewed. No pertinent family history.  ?Past Surgical History:  ?Procedure Laterality Date  ? BRAIN SURGERY    ? BREAST ENHANCEMENT SURGERY    ? CESAREAN SECTION    ? GASTRIC BYPASS    ? KNEE ARTHROSCOPY    ? TUBAL LIGATION    ? TUBAL LIGATION    ? ?Social  History  ? ?Occupational History  ? Not on file  ?Tobacco Use  ? Smoking status: Former  ?  Packs/day: 0.10  ?  Years: 3.00  ?  Pack years: 0.30  ?  Types: Cigarettes  ?  Quit date: 05/21/2002  ?  Years since quitting: 19.7  ? Smokeless tobacco: Never  ?Vaping Use  ? Vaping Use: Never used  ?Substance and Sexual Activity  ? Alcohol use: No  ? Drug use: No  ? Sexual activity: Not on file  ? ? ? ?Valeria Batman, MD ? ? ?Note - This record has been created using [employer].  ?Chart creation errors have been sought, but may not always  ?have been located. Such creation errors do not reflect on  ?the standard of medical care. ? ?

## 2022-02-19 ENCOUNTER — Ambulatory Visit (INDEPENDENT_AMBULATORY_CARE_PROVIDER_SITE_OTHER): Payer: BC Managed Care – PPO

## 2022-02-19 ENCOUNTER — Ambulatory Visit (INDEPENDENT_AMBULATORY_CARE_PROVIDER_SITE_OTHER): Payer: BC Managed Care – PPO | Admitting: Orthopedic Surgery

## 2022-02-19 DIAGNOSIS — M19041 Primary osteoarthritis, right hand: Secondary | ICD-10-CM

## 2022-02-19 DIAGNOSIS — M1811 Unilateral primary osteoarthritis of first carpometacarpal joint, right hand: Secondary | ICD-10-CM

## 2022-02-19 MED ORDER — LIDOCAINE HCL 1 % IJ SOLN
0.5000 mL | INTRAMUSCULAR | Status: AC | PRN
  Administered 2022-02-19: .5 mL

## 2022-02-19 MED ORDER — BETAMETHASONE SOD PHOS & ACET 6 (3-3) MG/ML IJ SUSP
3.0000 mg | INTRAMUSCULAR | Status: AC | PRN
  Administered 2022-02-19: 3 mg via INTRA_ARTICULAR

## 2022-02-19 NOTE — Progress Notes (Signed)
? ?Office Visit Note ?  ?Patient: Regina Rollins           ?Date of Birth: 05/27/1971           ?MRN: [id number] ?Visit Date: 02/19/2022 ?             ?Requested by: Renne Crigler, NP ?[address] [place] [address] ?PCP: Renne Crigler, NP ? ? ?Assessment & Plan: ?Visit Diagnoses:  ?1. Primary osteoarthritis, right hand   ?2. Arthritis of carpometacarpal [facility]) joint of right thumb   ? ? ?Plan: We reviewed the nature of thumb CMC arthritis as well as its diagnosis, prognosis, and both conservative and surgical treatment options.  We reviewed her x-ray which shows [place] stage II osteoarthritis of the thumb CMC joint.  There does appear to be some mild degenerative changes present at the STT joint.  She underwent injection of the left thumb CMC and IP joints approximate 1 week ago.  She she previously got several months of relief with corticosteroid injections.  She does not think that the injection at the [facility] joint was effective and is adamant that it was not in the right location.  She did not get any relief from this injection.  She is interested in repeat injection today.  We discussed the risks of repeat injection including bleeding, infection, incomplete symptom relief.  I will repeat injection today under fluoroscopic guidance today.  I can see him back in a few months when or if her symptoms return. ? ?Follow-Up Instructions: No follow-ups on file.  ? ?Orders:  ?Orders Placed This Encounter  ?Procedures  ?? XR Hand Complete Right  ? ?No orders of the defined types were placed in this encounter. ? ? ? ? Procedures: ?Hand/UE Inj: R thumb CMC for osteoarthritis on 02/19/2022 2:25 PM ?Indications: therapeutic and pain ?Details: 25 G needle, fluoroscopy-guided radial approach ?Medications: 0.5 mL lidocaine 1 %; 3 mg betamethasone acetate-betamethasone sodium phosphate 6 (3-3) MG/ML ?Procedure, treatment alternatives, risks and benefits explained, specific risks discussed. Consent was given by the patient.  Immediately prior to procedure a time out was called to verify the correct patient, procedure, equipment, support staff and site/side marked as required. Patient was prepped and draped in the usual sterile fashion.  ? ? ? ? ?Clinical Data: ?No additional findings. ? ? ?Subjective: ?Chief Complaint  ?Patient presents with  ?? Right Hand - Pain  ? ? 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  The pain is mostly at the thumb [facility] joint.  She rates it as 9/10 at worst.  She has mild pain at the MP and IP joints but the [facility] joint is the most severe.  She has had multiple treatments for this including oral Celebrex, CBD creams, prednisone taper, bracing, formal hand therapy, heat, ice.  She has significant difficulty at work given that she does fine motor activities that involve pinching and grasping.  She has had previous steroid injections to the thumb CMC joint with each lasting 2 or 3 months.  She underwent corticosteroid injection last week and notes that she does not get any relief from the injection.  Per chart review, she underwent injections of both the [facility] joint and the IP joint.  She notes that she had relief of her pain at the IP joint.  She is concerned that the injection was not in the actual [facility] joint. ? ? ?Review of Systems ? ? ?  Objective: ?Vital Signs: There were no vitals taken for this visit. ? ?Physical Exam ?Constitutional:   ?   Appearance: Normal appearance.  ?Cardiovascular:  ?   Rate and Rhythm: Normal rate.  ?   Pulses: Normal pulses.  ?Pulmonary:  ?   Effort: Pulmonary effort is normal.  ?Skin: ?   General: Skin is warm and dry.  ?   Capillary Refill: Capillary refill takes less than 2 seconds.  ?Neurological:  ?   Mental Status: She is alert.  ? ? ?Right Hand Exam  ? ?Tenderness  ?Right hand tenderness location: TTP at thumb [facility] joint w/ mild swelling.  No TTP at MP or IP joints. ? ?Other  ?Erythema: absent ?Sensation:  normal ?Pulse: present ? ?Comments:  Pain w/ CMC grind test w/out crepitus.  No static or dynamic MP hyper-extension.  No palmar abduction contracture. No pain w/ MP ROM.  Negative Finkelstein test.  No thumb triggering.  ? ? ? ? ?Specialty Comments:  ?No specialty comments available. ? ?Imaging: ?No results found. ? ? ?PMFS History: ?Patient Active Problem List  ? Diagnosis Date Noted  ?? Arthritis of carpometacarpal [facility]) joint of right thumb 02/19/2022  ?? Primary osteoarthritis, left hand 11/28/2020  ?? Toe joint pain, left 10/26/2018  ?? Primary osteoarthritis, right hand 04/09/2018  ?? Unilateral primary osteoarthritis, left knee 05/21/2017  ?? Osteoarthritis of left patellofemoral joint 05/21/2017  ? ?Past Medical History:  ?Diagnosis Date  ?? Fibromyositis   ?  ?No family history on file.  ?Past Surgical History:  ?Procedure Laterality Date  ?? BRAIN SURGERY    ?? BREAST ENHANCEMENT SURGERY    ?? CESAREAN SECTION    ?? GASTRIC BYPASS    ?? KNEE ARTHROSCOPY    ?? TUBAL LIGATION    ?? TUBAL LIGATION    ? ?Social History  ? ?Occupational History  ?? Not on file  ?Tobacco Use  ?? Smoking status: Former  ?  Packs/day: 0.10  ?  Years: 3.00  ?  Pack years: 0.30  ?  Types: Cigarettes  ?  Quit date: 05/21/2002  ?  Years since quitting: 19.7  ?? Smokeless tobacco: Never  ?Vaping Use  ?? Vaping Use: Never used  ?Substance and Sexual Activity  ?? Alcohol use: No  ?? Drug use: No  ?? Sexual activity: Not on file  ? ? ? ? ? ? ?

## 2022-03-29 ENCOUNTER — Ambulatory Visit (INDEPENDENT_AMBULATORY_CARE_PROVIDER_SITE_OTHER): Payer: BC Managed Care – PPO | Admitting: Orthopedic Surgery

## 2022-03-29 DIAGNOSIS — M1811 Unilateral primary osteoarthritis of first carpometacarpal joint, right hand: Secondary | ICD-10-CM | POA: Diagnosis not present

## 2022-03-29 NOTE — Progress Notes (Signed)
Office Visit Note   Patient: Regina Rollins           Date of Birth: 16-Mar-1971           MRN: [id number] Visit Date: 03/29/2022              Requested by: Verlee Monte, NP [address] [place],  [place] [address] PCP: Verlee Monte, NP   Assessment & Plan: Visit Diagnoses:  1. Arthritis of carpometacarpal (CMC) joint of right thumb     Plan: Patient presents with continued pain at the right thumb [facility] joint despite recent corticosteroid injection.  She notes that she got about a month of significant symptom relief.  We again reviewed the nature of CMC arthritis and both conservative and surgical treatment options.  Patient would like to proceed with surgical management.  We discussed the nature of thumb CMC surgery as well as the risks, benefits, and expected postoperative course.  The risks we discussed include bleeding, infection, damage to neurovascular structures, incomplete symptom relief, need for additional surgery.  Patient would like to proceed.  Given that she has a steroid injection one month ago, we will wait another 4 to 6 weeks before scheduling.  All questions were answered.  Follow-Up Instructions: No follow-ups on file.   Orders:  No orders of the defined types were placed in this encounter.  No orders of the defined types were placed in this encounter.     Procedures: No procedures performed   Clinical Data: No additional findings.   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  This has been going on for some time now.  She was last seen in office on 02/19/2022 at which time she underwent corticosteroid injection into the thumb CMC joint.  She got at least a month of significant symptom relief.  She was able to plant flowers, shovel her garden, and left household objects which she was unable to do  previously.  She has also tried oral Celebrex, CBD creams, prednisone taper, bracing, formal hand therapy, heat, and ice.  She still has significant difficulty with fine motor tasks at work involve pinching and grasping.  Given that she has tried nearly every conservative management available, she would like to discuss surgical treatment today.    Review of Systems   Objective: Vital Signs: There were no vitals taken for this visit.  Physical Exam Constitutional:      Appearance: Normal appearance.  Cardiovascular:     Rate and Rhythm: Normal rate.     Pulses: Normal pulses.  Pulmonary:     Effort: Pulmonary effort is normal.  Skin:    General: Skin is warm and dry.     Capillary Refill: Capillary refill takes less than 2 seconds.  Neurological:     Mental Status: She is alert.     Right Hand Exam   Tenderness  Right hand tenderness location: TTP at base of thumb at [facility] joint.  Other  Erythema: absent Sensation: normal Pulse: present  Comments:  Pain w/ CMC grind test w/out crepitus.  No static or dynamic MP hyper-extension.  No palmar abduction contracture of thumb.  No pain w/ MP ROM.      Specialty Comments:  No specialty comments available.  Imaging: No results found.   PMFS History: Patient Active Problem List  Diagnosis Date Noted   Arthritis of carpometacarpal [facility]) joint of right thumb 02/19/2022   Primary osteoarthritis, left hand 11/28/2020   Toe joint pain, left 10/26/2018   Primary osteoarthritis, right hand 04/09/2018   Unilateral primary osteoarthritis, left knee 05/21/2017   Osteoarthritis of left patellofemoral joint 05/21/2017   Past Medical History:  Diagnosis Date   Fibromyositis     No family history on file.  Past Surgical History:  Procedure Laterality Date   BRAIN SURGERY     BREAST ENHANCEMENT SURGERY     CESAREAN SECTION     GASTRIC BYPASS     KNEE ARTHROSCOPY     TUBAL LIGATION     TUBAL LIGATION     Social History    Occupational History   Not on file  Tobacco Use   Smoking status: Former    Packs/day: 0.10    Years: 3.00    Total pack years: 0.30    Types: Cigarettes    Quit date: 05/21/2002    Years since quitting: 19.8   Smokeless tobacco: Never  Vaping Use   Vaping Use: Never used  Substance and Sexual Activity   Alcohol use: No   Drug use: No   Sexual activity: Not on file

## 2022-04-02 ENCOUNTER — Other Ambulatory Visit: Payer: Self-pay | Admitting: Orthopaedic Surgery

## 2022-04-02 ENCOUNTER — Telehealth: Payer: Self-pay | Admitting: Orthopedic Surgery

## 2022-04-02 MED ORDER — HYDROCODONE-ACETAMINOPHEN 5-325 MG PO TABS: 1.0000 | ORAL_TABLET | Freq: Four times a day (QID) | ORAL | 0 refills | Status: AC | PRN

## 2022-04-02 NOTE — Telephone Encounter (Signed)
Please refer request to Dr Frazier Butt

## 2022-04-02 NOTE — Telephone Encounter (Signed)
Patient called needing something for pain. Patient uses [facility] on [address].  The number to contact patient is [phone]

## 2022-04-25 ENCOUNTER — Telehealth: Payer: Self-pay | Admitting: Orthopedic Surgery

## 2022-04-25 NOTE — Telephone Encounter (Signed)
Patient left a message stating she was calling in reference to surgery. I do not have a surgery sheet on her. Please advise.

## 2022-05-08 ENCOUNTER — Encounter (HOSPITAL_BASED_OUTPATIENT_CLINIC_OR_DEPARTMENT_OTHER): Payer: Self-pay | Admitting: Orthopedic Surgery

## 2022-05-08 ENCOUNTER — Other Ambulatory Visit: Payer: Self-pay

## 2022-05-08 NOTE — Telephone Encounter (Signed)
I spoke with patient, and scheduled surgery for 05/15/22 at 1:15 pm MCD.

## 2022-05-08 NOTE — Telephone Encounter (Deleted)
I spoke with patient, and scheduled her surgery for 05/27/22 at [facility].

## 2022-05-13 ENCOUNTER — Ambulatory Visit (INDEPENDENT_AMBULATORY_CARE_PROVIDER_SITE_OTHER): Payer: BC Managed Care – PPO | Admitting: Orthopedic Surgery

## 2022-05-13 DIAGNOSIS — M1811 Unilateral primary osteoarthritis of first carpometacarpal joint, right hand: Secondary | ICD-10-CM

## 2022-05-13 MED ORDER — LIDOCAINE HCL 1 % IJ SOLN
1.0000 mL | INTRAMUSCULAR | Status: AC | PRN
  Administered 2022-05-13: 1 mL

## 2022-05-13 MED ORDER — BETAMETHASONE SOD PHOS & ACET 6 (3-3) MG/ML IJ SUSP
6.0000 mg | INTRAMUSCULAR | Status: AC | PRN
  Administered 2022-05-13: 6 mg via INTRA_ARTICULAR

## 2022-05-13 NOTE — Progress Notes (Signed)
Office Visit Note   Patient: Regina Rollins           Date of Birth: Sep 23, 1971           MRN: [id number] Visit Date: 05/13/2022              Requested by: Verlee Monte, NP [address] [place],  [place] [address] PCP: Verlee Monte, NP   Assessment & Plan: Visit Diagnoses:  1. Arthritis of carpometacarpal (CMC) joint of right thumb     Plan: Patient presents to with continued pain at the right thumb CMC joint.  She underwent corticosteroid injection at the beginning of May with significant symptom relief until very recently.  She was initially scheduled for a right thumb CMC trapeziectomy with suspensioplasty but decided that she would rather try 1 more injection.  We again reviewed the risks of corticosteroid junctions.  I can see her back again as needed.  Follow-Up Instructions: No follow-ups on file.   Orders:  No orders of the defined types were placed in this encounter.  No orders of the defined types were placed in this encounter.     Procedures: Hand/UE Inj: R thumb CMC for osteoarthritis on 05/13/2022 11:45 AM Indications: pain Details: 25 G needle, fluoroscopy-guided radial approach Medications: 1 mL lidocaine 1 %; 6 mg betamethasone acetate-betamethasone sodium phosphate 6 (3-3) MG/ML Procedure, treatment alternatives, risks and benefits explained, specific risks discussed. Consent was given by the patient. Immediately prior to procedure a time out was called to verify the correct patient, procedure, equipment, support staff and site/side marked as required. Patient was prepped and draped in the usual sterile fashion.       Clinical Data: No additional findings.   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  This been going on for a while.  She underwent corticosteroid  injection into the thumb CMC joint the beginning of May with significant relief for well over 1 to 2 months.  She saw me last month at which time she wanted to schedule a [facility] arthroplasty given the significant symptoms despite corticosteroid injections.  She recently canceled that surgery and would like to try another corticosteroid injection.    Review of Systems   Objective: Vital Signs: There were no vitals taken for this visit.  Physical Exam Constitutional:      Appearance: Normal appearance.  Cardiovascular:     Rate and Rhythm: Normal rate.     Pulses: Normal pulses.  Pulmonary:     Effort: Pulmonary effort is normal.  Skin:    General: Skin is warm and dry.     Capillary Refill: Capillary refill takes less than 2 seconds.  Neurological:     Mental Status: She is alert.     Right Hand Exam   Tenderness  Right hand tenderness location: TTP at base of thumb at [facility] joint w/ mild swelling.  Other  Erythema: absent Sensation: normal Pulse: present  Comments:  Pain w/out crepitus with CMC grind test.  No static or dynamic MP hyper-extension.  No palmar abduction contracture of thumb.       Specialty Comments:  No specialty comments available.  Imaging: No results found.   PMFS History: Patient Active Problem List   Diagnosis Date Noted   Arthritis of carpometacarpal (CMC) joint of  right thumb 02/19/2022   Primary osteoarthritis, left hand 11/28/2020   Toe joint pain, left 10/26/2018   Primary osteoarthritis, right hand 04/09/2018   Unilateral primary osteoarthritis, left knee 05/21/2017   Osteoarthritis of left patellofemoral joint 05/21/2017   Past Medical History:  Diagnosis Date   ADHD (attention deficit hyperactivity disorder)    Anemia    iron deficient anemia   Anxiety    Arthritis    Depression    Fibromyalgia    Fibromyositis    Hypothyroidism     No family history on file.  Past Surgical History:  Procedure Laterality Date   ABLATION      BREAST ENHANCEMENT SURGERY     CESAREAN SECTION     COMBINED AUGMENTATION MAMMAPLASTY AND ABDOMINOPLASTY  2016   GASTRIC BYPASS  2012   KNEE ARTHROSCOPY     TONSILLECTOMY     TUBAL LIGATION     TUBAL LIGATION     Social History   Occupational History   Not on file  Tobacco Use   Smoking status: Former    Packs/day: 0.10    Years: 3.00    Total pack years: 0.30    Types: Cigarettes    Quit date: 05/21/2002    Years since quitting: 19.9   Smokeless tobacco: Never  Vaping Use   Vaping Use: Never used  Substance and Sexual Activity   Alcohol use: No   Drug use: No   Sexual activity: Not Currently    Birth control/protection: Surgical    Comment: BTL, ablation

## 2022-05-15 ENCOUNTER — Ambulatory Visit (HOSPITAL_BASED_OUTPATIENT_CLINIC_OR_DEPARTMENT_OTHER)
Admission: RE | Admit: 2022-05-15 | Payer: BC Managed Care – PPO | Source: Ambulatory Visit | Admitting: Orthopedic Surgery

## 2022-05-15 DIAGNOSIS — Z01818 Encounter for other preprocedural examination: Secondary | ICD-10-CM

## 2022-05-15 HISTORY — DX: Fibromyalgia: M79.7

## 2022-05-15 HISTORY — DX: Hypothyroidism, unspecified: E03.9

## 2022-05-15 HISTORY — DX: Depression, unspecified: F32.A

## 2022-05-15 HISTORY — DX: Attention-deficit hyperactivity disorder, unspecified type: F90.9

## 2022-05-15 HISTORY — DX: Anxiety disorder, unspecified: F41.9

## 2022-05-15 HISTORY — DX: Unspecified osteoarthritis, unspecified site: M19.90

## 2022-05-15 HISTORY — DX: Anemia, unspecified: D64.9

## 2022-05-15 SURGERY — CARPOMETACARPEL (CMC) SUSPENSION PLASTY
Anesthesia: Choice | Laterality: Right

## 2022-05-16 ENCOUNTER — Ambulatory Visit: Payer: BC Managed Care – PPO | Admitting: Orthopedic Surgery

## 2022-05-27 ENCOUNTER — Encounter: Payer: BC Managed Care – PPO | Admitting: Orthopedic Surgery

## 2022-07-10 ENCOUNTER — Encounter: Payer: Self-pay | Admitting: Emergency Medicine

## 2022-07-10 ENCOUNTER — Ambulatory Visit
Admission: EM | Admit: 2022-07-10 | Discharge: 2022-07-10 | Disposition: A | Discharge status: Home / Self Care | Payer: BC Managed Care – PPO | Attending: Urgent Care | Admitting: Urgent Care

## 2022-07-10 DIAGNOSIS — R52 Pain, unspecified: Secondary | ICD-10-CM | POA: Diagnosis not present

## 2022-07-10 DIAGNOSIS — Z20822 Contact with and (suspected) exposure to covid-19: Secondary | ICD-10-CM | POA: Insufficient documentation

## 2022-07-10 DIAGNOSIS — J018 Other acute sinusitis: Secondary | ICD-10-CM | POA: Insufficient documentation

## 2022-07-10 DIAGNOSIS — Z8739 Personal history of other diseases of the musculoskeletal system and connective tissue: Secondary | ICD-10-CM | POA: Insufficient documentation

## 2022-07-10 DIAGNOSIS — M797 Fibromyalgia: Secondary | ICD-10-CM | POA: Insufficient documentation

## 2022-07-10 DIAGNOSIS — R5381 Other malaise: Secondary | ICD-10-CM | POA: Diagnosis not present

## 2022-07-10 DIAGNOSIS — R5383 Other fatigue: Secondary | ICD-10-CM | POA: Diagnosis not present

## 2022-07-10 DIAGNOSIS — Z862 Personal history of diseases of the blood and blood-forming organs and certain disorders involving the immune mechanism: Secondary | ICD-10-CM | POA: Diagnosis not present

## 2022-07-10 MED ORDER — DOXYCYCLINE HYCLATE 100 MG PO CAPS: 100.0000 mg | ORAL_CAPSULE | Freq: Two times a day (BID) | ORAL | 0 refills | Status: DC

## 2022-07-10 MED ORDER — PROMETHAZINE-DM 6.25-15 MG/5ML PO SYRP: 2.5000 mL | ORAL_SOLUTION | Freq: Three times a day (TID) | ORAL | 0 refills | Status: DC | PRN

## 2022-07-10 NOTE — ED Provider Notes (Signed)
[facility]  Note:  This document was prepared using Conservation officer, historic buildings and may include unintentional dictation errors.  MRN: [id number] DOB: 02-19-1971  Subjective:   De'An Marquard is a 51 y.o. female presenting for 2 week history of persistent malaise and fatigue, body pains all over. Has noticed that over the past 5 days has gotten substantially worse with left sided sinus pressure, facial pain that she can flush out but readily fills back in. No cough, chest pain, shob, wheezing. Has a history of fibromyalgia, fibromyositis. Would like to have respiratory testing. No smoking. Medication allergies on file.   No current facility-administered medications for this encounter.  Current Outpatient Medications:    hydrochlorothiazide (HYDRODIURIL) 25 MG tablet, Take 25 mg by mouth daily at 2 PM., Disp: , Rfl:    ALPRAZolam (XANAX) 1 MG tablet, Take 1 mg by mouth daily., Disp: , Rfl:    amphetamine-dextroamphetamine (ADDERALL) 20 MG tablet, Take 40 mg by mouth., Disp: , Rfl:    busPIRone (BUSPAR) 10 MG tablet, Take 10 mg by mouth 2 (two) times daily., Disp: , Rfl:    levothyroxine (SYNTHROID) 25 MCG tablet, Take 25 mcg by mouth daily before breakfast., Disp: , Rfl:    minocycline (MINOCIN,DYNACIN) 100 MG capsule, Take 100 mg by mouth 2 (two) times daily., Disp: , Rfl:    pramipexole (MIRAPEX) 1 MG tablet, Take 1 mg by mouth at bedtime., Disp: , Rfl:    Rimegepant Sulfate (NURTEC) 75 MG TBDP, Take by mouth., Disp: , Rfl:    vortioxetine HBr (TRINTELLIX) 10 MG TABS tablet, Take 10 mg by mouth daily., Disp: , Rfl:    Allergies  Allergen Reactions   Ciprofloxacin Anaphylaxis   Sulfa Antibiotics Anaphylaxis   Ketorolac Tromethamine Nausea And Vomiting   Codeine    Penicillins    Penicillins    Sulfa Antibiotics    Sulfa Antibiotics    Toradol [Ketorolac Tromethamine]    Tramadol    Penicillins Rash    Past Medical History:  Diagnosis Date   ADHD  (attention deficit hyperactivity disorder)    Anemia    iron deficient anemia   Anxiety    Arthritis    Depression    Fibromyalgia    Fibromyositis    Hypothyroidism      Past Surgical History:  Procedure Laterality Date   ABLATION     BREAST ENHANCEMENT SURGERY     CESAREAN SECTION     COMBINED AUGMENTATION MAMMAPLASTY AND ABDOMINOPLASTY  2016   GASTRIC BYPASS  2012   KNEE ARTHROSCOPY     TONSILLECTOMY     TUBAL LIGATION     TUBAL LIGATION      No family history on file.  Social History   Tobacco Use   Smoking status: Former    Packs/day: 0.10    Years: 3.00    Total pack years: 0.30    Types: Cigarettes    Quit date: 05/21/2002    Years since quitting: 20.1   Smokeless tobacco: Never  Vaping Use   Vaping Use: Never used  Substance Use Topics   Alcohol use: No   Drug use: No    ROS   Objective:   Vitals: BP 137/87 (BP Location: Right Arm)   Pulse 70   Temp 98.5 F (36.9 C) (Oral)   Resp 17   SpO2 98%   Physical Exam Constitutional:      General: She is not in acute distress.  Appearance: Normal appearance. She is well-developed and normal weight. She is not ill-appearing, toxic-appearing or diaphoretic.  HENT:     Head: Normocephalic and atraumatic.     Right Ear: Tympanic membrane, ear canal and external ear normal. No drainage or tenderness. No middle ear effusion. There is no impacted cerumen. Tympanic membrane is not erythematous.     Left Ear: Tympanic membrane, ear canal and external ear normal. No drainage or tenderness.  No middle ear effusion. There is no impacted cerumen. Tympanic membrane is not erythematous.     Nose: Nose normal. No congestion or rhinorrhea.     Mouth/Throat:     Mouth: Mucous membranes are moist. No oral lesions.     Pharynx: No pharyngeal swelling, oropharyngeal exudate, posterior oropharyngeal erythema or uvula swelling.     Tonsils: No tonsillar exudate or tonsillar abscesses.  Eyes:     General: No scleral  icterus.       Right eye: No discharge.        Left eye: No discharge.     Extraocular Movements: Extraocular movements intact.     Right eye: Normal extraocular motion.     Left eye: Normal extraocular motion.     Conjunctiva/sclera: Conjunctivae normal.  Cardiovascular:     Rate and Rhythm: Normal rate and regular rhythm.     Heart sounds: Normal heart sounds. No murmur heard.    No friction rub. No gallop.  Pulmonary:     Effort: Pulmonary effort is normal. No respiratory distress.     Breath sounds: No stridor. No wheezing, rhonchi or rales.  Chest:     Chest wall: No tenderness.  Musculoskeletal:     Cervical back: Normal range of motion and neck supple.  Lymphadenopathy:     Cervical: No cervical adenopathy.  Skin:    General: Skin is warm and dry.  Neurological:     General: No focal deficit present.     Mental Status: She is alert and oriented to person, place, and time.  Psychiatric:        Mood and Affect: Mood normal.        Behavior: Behavior normal.     Assessment and Plan :   PDMP not reviewed this encounter.  1. Acute non-recurrent sinusitis of other sinus   2. Body aches   3. History of fibromyalgia   4. Malaise and fatigue   5. History of anemia     Will start empiric treatment for sinusitis with doxycycline given the timeline, left sided maxillary sinus symptoms.  Recommended supportive care otherwise including the use of oral antihistamine, decongestant. Deferred imaging given clear cardiopulmonary exam, hemodynamically stable vital signs. Testing is pending. Counseled patient on potential for adverse effects with medications prescribed/recommended today, ER and return-to-clinic precautions discussed, patient verbalized understanding.    Jaynee Eagles, [place] 07/11/22 [phone]

## 2022-07-10 NOTE — Discharge Instructions (Addendum)

## 2022-07-10 NOTE — ED Triage Notes (Signed)
Pt having aching joints, hair and head pains as well as body pains, fatigue that started 2 weeks ago that was intermittent through out day but been constant and worse the past 4-5 days.

## 2022-07-11 LAB — RESP PANEL BY RT-PCR (FLU A&B, COVID) ARPGX2
Influenza A by PCR: NEGATIVE
Influenza B by PCR: NEGATIVE
SARS Coronavirus 2 by RT PCR: NEGATIVE

## 2023-01-30 ENCOUNTER — Ambulatory Visit (INDEPENDENT_AMBULATORY_CARE_PROVIDER_SITE_OTHER): Payer: Medicaid Other | Admitting: Physician Assistant

## 2023-01-30 ENCOUNTER — Encounter: Payer: Self-pay | Admitting: Physician Assistant

## 2023-01-30 DIAGNOSIS — M25561 Pain in right knee: Secondary | ICD-10-CM | POA: Diagnosis not present

## 2023-01-30 MED ORDER — BUPIVACAINE HCL 0.25 % IJ SOLN
2.0000 mL | INTRAMUSCULAR | Status: AC | PRN
  Administered 2023-01-30: 2 mL via INTRA_ARTICULAR

## 2023-01-30 MED ORDER — METHYLPREDNISOLONE ACETATE 40 MG/ML IJ SUSP
80.0000 mg | INTRAMUSCULAR | Status: AC | PRN
  Administered 2023-01-30: 80 mg via INTRA_ARTICULAR

## 2023-01-30 MED ORDER — LIDOCAINE HCL 1 % IJ SOLN
2.0000 mL | INTRAMUSCULAR | Status: AC | PRN
  Administered 2023-01-30: 2 mL

## 2023-01-30 NOTE — Progress Notes (Addendum)
Office Visit Note   Patient: Regina Rollins           Date of Birth: 07-Jan-1971           MRN: [id number] Visit Date: 01/30/2023              Requested by: Verlee Monte, NP [address] [place],  [place] [address] PCP: Verlee Monte, NP  Chief Complaint  Patient presents with   Right Knee - Pain      HPI: Regina Rollins is a former patient of Dr. Cleophas Dunker.  She comes in today complaining of right knee pain.  She last saw Dr. Cleophas Dunker in 2020 with problems with her left knee She also had an MRI review at that time of the left knee which demonstrated no meniscus tear but tricompartmental degenerative changes.  She began to develop problems in her left knee no noted injury and was seen at [facility].  She had an MRI recently of her right MRI done at [facility] which did demonstrate a medial meniscus tear but also continued arthritic changes per her report.  She was to undergo an arthroscopy of her knee yesterday but decided to cancel this.  Rates her pain as mild to moderate denies any instability.  She was wondering if she could have an injection.  Assessment & Plan: Visit Diagnoses: Right knee osteoarthritis meniscal tear  Plan: She would like to go forward with just a cortisone injection to see how she does today.  She has not had a right knee x-ray in a while and I would encourage her at her next visit to obtain this.  Certainly it is hard to tell whether her pain today is from a meniscus tear or from arthritis in her knee.  She also has fibromyalgia which could be a contributing factor.  If she does not get relief from the injection and has mechanical symptoms then the problem may be more contributed to her meniscus tear.  Follow-Up Instructions: As needed  Ortho Exam  Patient is alert, oriented, no adenopathy, well-dressed, normal affect, normal respiratory effort. Right knee no effusion no erythema no ecchymosis.  She has good flexion and extension strength.  She does have some  medial lateral joint line pain.  Good varus valgus stability.  Imaging: No results found. No images are attached to the encounter.  Labs: Lab Results  Component Value Date   LABURIC 4.6 10/26/2018     No results found for: "ALBUMIN", "PREALBUMIN", "CBC"  No results found for: "MG" No results found for: "VD25OH"  No results found for: "PREALBUMIN"     No data to display           There is no height or weight on file to calculate BMI.  Orders:  No orders of the defined types were placed in this encounter.  No orders of the defined types were placed in this encounter.    Procedures: Large Joint Inj: R knee on 01/30/2023 11:40 AM Indications: pain and diagnostic evaluation Details: 25 G 1.5 in needle, anteromedial approach  Arthrogram: No  Medications: 80 mg methylPREDNISolone acetate 40 MG/ML; 2 mL lidocaine 1 %; 2 mL bupivacaine 0.25 % Outcome: tolerated well, no immediate complications Procedure, treatment alternatives, risks and benefits explained, specific risks discussed. Consent was given by the patient.      Clinical Data: No additional findings.  ROS:  All other systems negative, except as noted in the HPI. Review of Systems  Objective: Vital Signs: There were  no vitals taken for this visit.  Specialty Comments:  No specialty comments available.  PMFS History: Patient Active Problem List   Diagnosis Date Noted   Arthritis of carpometacarpal [facility]) joint of right thumb 02/19/2022   Primary osteoarthritis, left hand 11/28/2020   Toe joint pain, left 10/26/2018   Primary osteoarthritis, right hand 04/09/2018   Unilateral primary osteoarthritis, left knee 05/21/2017   Osteoarthritis of left patellofemoral joint 05/21/2017   Past Medical History:  Diagnosis Date   ADHD (attention deficit hyperactivity disorder)    Anemia    iron deficient anemia   Anxiety    Arthritis    Depression    Fibromyalgia    Fibromyositis    Hypothyroidism      History reviewed. No pertinent family history.  Past Surgical History:  Procedure Laterality Date   ABLATION     BREAST ENHANCEMENT SURGERY     CESAREAN SECTION     COMBINED AUGMENTATION MAMMAPLASTY AND ABDOMINOPLASTY  2016   GASTRIC BYPASS  2012   KNEE ARTHROSCOPY     TONSILLECTOMY     TUBAL LIGATION     TUBAL LIGATION     Social History   Occupational History   Not on file  Tobacco Use   Smoking status: Former    Packs/day: 0.10    Years: 3.00    Additional pack years: 0.00    Total pack years: 0.30    Types: Cigarettes    Quit date: 05/21/2002    Years since quitting: 20.7   Smokeless tobacco: Never  Vaping Use   Vaping Use: Never used  Substance and Sexual Activity   Alcohol use: No   Drug use: No   Sexual activity: Not Currently    Birth control/protection: Surgical    Comment: BTL, ablation

## 2023-02-19 ENCOUNTER — Ambulatory Visit: Payer: Medicaid Other | Admitting: Orthopaedic Surgery

## 2023-05-21 ENCOUNTER — Other Ambulatory Visit: Payer: Self-pay

## 2023-05-21 ENCOUNTER — Encounter: Payer: Self-pay | Admitting: Physician Assistant

## 2023-05-21 ENCOUNTER — Ambulatory Visit: Payer: Medicaid Other | Admitting: Physician Assistant

## 2023-05-21 DIAGNOSIS — G8929 Other chronic pain: Secondary | ICD-10-CM

## 2023-05-21 DIAGNOSIS — M25561 Pain in right knee: Secondary | ICD-10-CM

## 2023-05-21 DIAGNOSIS — M353 Polymyalgia rheumatica: Secondary | ICD-10-CM

## 2023-05-21 MED ORDER — METHYLPREDNISOLONE ACETATE 40 MG/ML IJ SUSP
40.0000 mg | INTRAMUSCULAR | Status: AC | PRN
  Administered 2023-05-21: 40 mg via INTRA_ARTICULAR

## 2023-05-21 MED ORDER — BUPIVACAINE HCL 0.25 % IJ SOLN
2.0000 mL | INTRAMUSCULAR | Status: AC | PRN
  Administered 2023-05-21: 2 mL via INTRA_ARTICULAR

## 2023-05-21 MED ORDER — LIDOCAINE HCL 1 % IJ SOLN
2.0000 mL | INTRAMUSCULAR | Status: AC | PRN
  Administered 2023-05-21: 2 mL

## 2023-05-21 NOTE — Progress Notes (Signed)
Office Visit Note   Patient: Regina Rollins           Date of Birth: 22-Dec-1970           MRN: [id number] Visit Date: 05/21/2023              Requested by: Regina Monte, NP [address] Regina Rollins,  [place] [address] PCP: Regina Monte, NP  Chief Complaint  Patient presents with  . Right Hand - Pain  . Right Knee - Pain      HPI: Regina Rollins is a pleasant 52 year old woman with a history of right knee pain.  She is a former patient of Regina Rollins.  She has a history of right knee pain and mechanical symptoms and that she cannot bend or extend her leg all the way.  That is been going on for about a year since she had a move.  Before that she did not have much problems with her knee.  She did go to [facility]ina Roll[facility] and had an MRI of her right knee done there.  She was told she had some arthritis but also meniscus tear.  They had planned on surgery but she decided to cancel this at the last minute because she was unsure of their diagnosis.  We have given her a steroid injection which has helped her in the past but has not helped with the difficulty she has with limited range of motion. She also has history of arthritis in both of her hands.  This was injected periodically by Regina Rollins and her last injection was by Regina Rollins under fluoroscopic guidance.  Her hands continue to bother her.  No new injury rates her pain is moderate She also discusses an overall polymyalgia.  She says all of her joints and bones just hurt.  No significant history  Assessment & Plan: Visit Diagnoses:  1. Chronic pain of right knee     Plan: Will go forward with an injection today.  I do think she would benefit from following with our hand specialist if encouraged her to do so.  Would also like for her to try and obtain the MRI and the report and follow-up with Regina Rollins to see if arthroscopy for the meniscus tear might be helpful.  Follow-Up Instructions: No follow-ups on file.   Ortho Exam  Patient is  alert, oriented, no adenopathy, well-dressed, normal affect, normal respiratory effort. Right knee no effusion no erythema she does have some mild soft tissue swelling compartments are soft and nontender she has good dorsiflexion plantarflexion just global tenderness medial laterally and some grinding with range of motion she does have a  bit limited motion with flexion  Imaging: No results found. No images are attached to the encounter.  Labs: Lab Results  Component Value Date   LABURIC 4.6 10/26/2018     No results found for: "ALBUMIN", "PREALBUMIN", "CBC"  No results found for: "MG" No results found for: "VD25OH"  No results found for: "PREALBUMIN"     No data to display           There is no height or weight on file to calculate BMI.  Orders:  Orders Placed This Encounter  Procedures  . XR KNEE 3 VIEW RIGHT  . Ambulatory referral to Rheumatology   No orders of the defined types were placed in this encounter.    Procedures: Large Joint Inj: R knee on 05/21/2023 9:12 AM Indications: pain and diagnostic evaluation Details: 25 G 1.5  in needle, anteromedial approach  Arthrogram: No  Medications: 40 mg methylPREDNISolone acetate 40 MG/ML; 2 mL lidocaine 1 %; 2 mL bupivacaine 0.25 % Outcome: tolerated well, no immediate complications Procedure, treatment alternatives, risks and benefits explained, specific risks discussed. Consent was given by the patient.    Clinical Data: No additional findings.  ROS:  All other systems negative, except as noted in the HPI. Review of Systems  Objective: Vital Signs: There were no vitals taken for this visit.  Specialty Comments:  No specialty comments available.  PMFS History: Patient Active Problem List   Diagnosis Date Noted  . Arthritis of carpometacarpal [facility]) joint of right thumb 02/19/2022  . Primary osteoarthritis, left hand 11/28/2020  . Toe joint pain, left 10/26/2018  . Primary osteoarthritis, right hand  04/09/2018  . Unilateral primary osteoarthritis, left knee 05/21/2017  . Osteoarthritis of left patellofemoral joint 05/21/2017   Past Medical History:  Diagnosis Date  . ADHD (attention deficit hyperactivity disorder)   . Anemia    iron deficient anemia  . Anxiety   . Arthritis   . Depression   . Fibromyalgia   . Fibromyositis   . Hypothyroidism     No family history on file.  Past Surgical History:  Procedure Laterality Date  . ABLATION    . BREAST ENHANCEMENT SURGERY    . CESAREAN SECTION    . COMBINED AUGMENTATION MAMMAPLASTY AND ABDOMINOPLASTY  2016  . GASTRIC BYPASS  2012  . KNEE ARTHROSCOPY    . TONSILLECTOMY    . TUBAL LIGATION    . TUBAL LIGATION     Social History   Occupational History  . Not on file  Tobacco Use  . Smoking status: Former    Current packs/day: 0.00    Average packs/day: 0.1 packs/day for 3.0 years (0.3 ttl pk-yrs)    Types: Cigarettes    Start date: 05/22/1999    Quit date: 05/21/2002    Years since quitting: 21.0  . Smokeless tobacco: Never  Vaping Use  . Vaping status: Never Used  Substance and Sexual Activity  . Alcohol use: No  . Drug use: No  . Sexual activity: Not Currently    Birth control/protection: Surgical    Comment: BTL, ablation

## 2023-05-27 ENCOUNTER — Ambulatory Visit: Payer: Medicaid Other | Admitting: Orthopaedic Surgery

## 2023-05-27 DIAGNOSIS — M25561 Pain in right knee: Secondary | ICD-10-CM | POA: Diagnosis not present

## 2023-05-27 DIAGNOSIS — G8929 Other chronic pain: Secondary | ICD-10-CM | POA: Diagnosis not present

## 2023-05-27 DIAGNOSIS — M1811 Unilateral primary osteoarthritis of first carpometacarpal joint, right hand: Secondary | ICD-10-CM

## 2023-05-27 MED ORDER — LIDOCAINE HCL 1 % IJ SOLN
0.3000 mL | INTRAMUSCULAR | Status: AC | PRN
  Administered 2023-05-27: .3 mL

## 2023-05-27 MED ORDER — BUPIVACAINE HCL 0.5 % IJ SOLN
0.3000 mL | INTRAMUSCULAR | Status: AC | PRN
  Administered 2023-05-27: .3 mL

## 2023-05-27 MED ORDER — METHYLPREDNISOLONE ACETATE 40 MG/ML IJ SUSP
0.3000 mg | INTRAMUSCULAR | Status: AC | PRN
  Administered 2023-05-27: .3 mg

## 2023-05-27 NOTE — Progress Notes (Signed)
Office Visit Note   Patient: Regina Rollins           Date of Birth: 03/26/1971           MRN: [id number] Visit Date: 05/27/2023              Requested by: Verlee Monte, NP [address] [place],  [place] [address] PCP: Verlee Monte, NP   Assessment & Plan: Visit Diagnoses:  1. Chronic pain of right knee   2. Arthritis of carpometacarpal [facility] joint of right thumb     Plan: Patient is a 52 year old female with advanced right thumb CMC arthritis and right knee pain probable patellofemoral arthritis.  In regards to the thumb we performed a CMC injection today.  For the knee I feel that her reported symptoms are more along the lines of patellofemoral arthritis and not from the reported meniscus tear from an MRI that she had at [facility].  I feel that fibromyalgia plays a role in her pain levels as well.  Follow-Up Instructions: No follow-ups on file.   Orders:  No orders of the defined types were placed in this encounter.  No orders of the defined types were placed in this encounter.     Procedures: Hand/UE Inj: R thumb CMC for osteoarthritis on 05/27/2023 8:39 AM Indications: pain Details: 25 G needle Medications: 0.3 mL lidocaine 1 %; 0.3 mg methylPREDNISolone acetate 40 MG/ML; 0.3 mL bupivacaine 0.5 % Outcome: tolerated well, no immediate complications Patient was prepped and draped in the usual sterile fashion.       Clinical Data: No additional findings.   Subjective: Chief Complaint  Patient presents with   Right Knee - Pain    HPI Regina Rollins comes in today for evaluation of right knee pain and right thumb CMC arthritis.  In regards to the knee she has retropatellar pain that prevents squatting and kneeling.  Denies any mechanical symptoms.  She had a cortisone injection last week which has not helped to a significant degree.  She also has known right thumb CMC arthritis and she has had periodic injections for this. Review of Systems  Constitutional:  Negative.   HENT: Negative.    Eyes: Negative.   Respiratory: Negative.    Cardiovascular: Negative.   Endocrine: Negative.   Musculoskeletal: Negative.   Neurological: Negative.   Hematological: Negative.   Psychiatric/Behavioral: Negative.    All other systems reviewed and are negative.    Objective: Vital Signs: There were no vitals taken for this visit.  Physical Exam Vitals and nursing note reviewed.  Constitutional:      Appearance: She is well-developed.  HENT:     Head: Atraumatic.     Nose: Nose normal.  Eyes:     Extraocular Movements: Extraocular movements intact.  Cardiovascular:     Pulses: Normal pulses.  Pulmonary:     Effort: Pulmonary effort is normal.  Abdominal:     Palpations: Abdomen is soft.  Musculoskeletal:     Cervical back: Neck supple.  Skin:    General: Skin is warm.     Capillary Refill: Capillary refill takes less than 2 seconds.  Neurological:     Mental Status: She is alert. Mental status is at baseline.  Psychiatric:        Behavior: Behavior normal.        Thought Content: Thought content normal.        Judgment: Judgment normal.     Ortho Exam Examination of the right  knee shows no joint effusion or joint line tenderness.  Negative Mc[place]ray.  She has excellent passive range of motion.  On active range of motion she feels pain behind the patella with flexion past 120 degrees.  Examination of the right thumb shows pain with CMC grind test. Specialty Comments:  No specialty comments available.  Imaging: No results found.   PMFS History: Patient Active Problem List   Diagnosis Date Noted   Chronic pain of right knee 05/27/2023   Polymyalgia rheumatica (HCC) 05/21/2023   Arthritis of carpometacarpal (CMC) joint of right thumb 02/19/2022   Primary osteoarthritis, left hand 11/28/2020   Toe joint pain, left 10/26/2018   Primary osteoarthritis, right hand 04/09/2018   Unilateral primary osteoarthritis, left knee 05/21/2017    Osteoarthritis of left patellofemoral joint 05/21/2017   Past Medical History:  Diagnosis Date   ADHD (attention deficit hyperactivity disorder)    Anemia    iron deficient anemia   Anxiety    Arthritis    Depression    Fibromyalgia    Fibromyositis    Hypothyroidism     No family history on file.  Past Surgical History:  Procedure Laterality Date   ABLATION     BREAST ENHANCEMENT SURGERY     CESAREAN SECTION     COMBINED AUGMENTATION MAMMAPLASTY AND ABDOMINOPLASTY  2016   GASTRIC BYPASS  2012   KNEE ARTHROSCOPY     TONSILLECTOMY     TUBAL LIGATION     TUBAL LIGATION     Social History   Occupational History   Not on file  Tobacco Use   Smoking status: Former    Current packs/day: 0.00    Average packs/day: 0.1 packs/day for 3.0 years (0.3 ttl pk-yrs)    Types: Cigarettes    Start date: 05/22/1999    Quit date: 05/21/2002    Years since quitting: 21.0   Smokeless tobacco: Never  Vaping Use   Vaping status: Never Used  Substance and Sexual Activity   Alcohol use: No   Drug use: No   Sexual activity: Not Currently    Birth control/protection: Surgical    Comment: BTL, ablation

## 2023-06-16 ENCOUNTER — Ambulatory Visit: Payer: Medicaid Other | Admitting: Orthopedic Surgery

## 2023-07-10 ENCOUNTER — Encounter (HOSPITAL_BASED_OUTPATIENT_CLINIC_OR_DEPARTMENT_OTHER): Payer: Self-pay | Admitting: Orthopaedic Surgery

## 2023-07-14 ENCOUNTER — Telehealth: Payer: Self-pay | Admitting: Orthopaedic Surgery

## 2023-07-14 NOTE — Telephone Encounter (Signed)
thanks

## 2023-07-14 NOTE — Telephone Encounter (Signed)
[facility] called to say patient had contacted them wanting to cancel her surgery for Wednesday due to transportation issues.  Called patient to confirm. Voicemail is full. Unable to leave message.  Surgery cancelled. Post op cancelled.   CDS/OP  RIGHT THUMB CMC ARTHROPLASTY  07-16-23 @ 2PM DR. MICHAEL XU

## 2023-07-16 ENCOUNTER — Ambulatory Visit (HOSPITAL_BASED_OUTPATIENT_CLINIC_OR_DEPARTMENT_OTHER): Admission: RE | Admit: 2023-07-16 | Payer: Medicaid Other | Source: Home / Self Care | Admitting: Orthopaedic Surgery

## 2023-07-16 DIAGNOSIS — Z01818 Encounter for other preprocedural examination: Secondary | ICD-10-CM

## 2023-07-16 SURGERY — CARPOMETACARPEL (CMC) SUSPENSION PLASTY
Anesthesia: Monitor Anesthesia Care | Site: Thumb | Laterality: Right

## 2023-07-29 ENCOUNTER — Encounter: Payer: Medicaid Other | Admitting: Orthopaedic Surgery

## 2023-11-24 ENCOUNTER — Telehealth: Payer: Self-pay | Admitting: Physician Assistant

## 2023-11-24 NOTE — Telephone Encounter (Signed)
Patient called needing to get the gel injection again for right knee.  Patient said she has [facility] Member # [id number]  Gp# [phone] The number to contact patient is [phone]

## 2023-12-16 NOTE — Telephone Encounter (Signed)
 VOB submitted for Durolane, right knee.

## 2024-08-23 ENCOUNTER — Encounter: Payer: Self-pay | Admitting: Radiology
